# Patient Record
Sex: Female | Born: 1965 | Race: White | Hispanic: No | Smoking: Current every day smoker
Health system: Southern US, Community
[De-identification: ages and names within clinical notes are randomized; demographics above are authoritative.]

## PROBLEM LIST (undated history)

## (undated) DIAGNOSIS — F1121 Opioid dependence, in remission: Secondary | ICD-10-CM

## (undated) DIAGNOSIS — G8929 Other chronic pain: Secondary | ICD-10-CM

## (undated) DIAGNOSIS — E119 Type 2 diabetes mellitus without complications: Secondary | ICD-10-CM

## (undated) DIAGNOSIS — E669 Obesity, unspecified: Secondary | ICD-10-CM

## (undated) HISTORY — PX: KNEE ARTHROSCOPY: SUR90

---

## 2004-12-07 ENCOUNTER — Emergency Department (HOSPITAL_COMMUNITY): Admission: EM | Admit: 2004-12-07 | Discharge: 2004-12-07 | Payer: Self-pay | Admitting: Emergency Medicine

## 2005-01-06 ENCOUNTER — Emergency Department (HOSPITAL_COMMUNITY): Admission: EM | Admit: 2005-01-06 | Discharge: 2005-01-06 | Payer: Self-pay | Admitting: Emergency Medicine

## 2005-05-29 ENCOUNTER — Emergency Department (HOSPITAL_COMMUNITY): Admission: EM | Admit: 2005-05-29 | Discharge: 2005-05-29 | Payer: Self-pay | Admitting: Emergency Medicine

## 2010-10-17 ENCOUNTER — Emergency Department (HOSPITAL_COMMUNITY)
Admission: EM | Admit: 2010-10-17 | Discharge: 2010-10-17 | Payer: Self-pay | Source: Home / Self Care | Admitting: Emergency Medicine

## 2010-12-12 ENCOUNTER — Emergency Department (HOSPITAL_COMMUNITY)
Admission: EM | Admit: 2010-12-12 | Discharge: 2010-12-12 | Disposition: A | Discharge status: Home / Self Care | Payer: Self-pay | Attending: Emergency Medicine | Admitting: Emergency Medicine

## 2010-12-12 DIAGNOSIS — R51 Headache: Secondary | ICD-10-CM | POA: Insufficient documentation

## 2011-10-25 DIAGNOSIS — F1121 Opioid dependence, in remission: Secondary | ICD-10-CM

## 2011-10-25 HISTORY — DX: Opioid dependence, in remission: F11.21

## 2013-10-24 DIAGNOSIS — E669 Obesity, unspecified: Secondary | ICD-10-CM

## 2013-10-24 HISTORY — DX: Obesity, unspecified: E66.9

## 2014-04-22 ENCOUNTER — Encounter (HOSPITAL_COMMUNITY): Payer: Self-pay | Admitting: Emergency Medicine

## 2014-04-22 ENCOUNTER — Emergency Department (HOSPITAL_COMMUNITY): Payer: Self-pay

## 2014-04-22 ENCOUNTER — Inpatient Hospital Stay (HOSPITAL_COMMUNITY)
Admission: EM | Admit: 2014-04-22 | Discharge: 2014-04-27 | DRG: 440 | Disposition: A | Discharge status: Home / Self Care | Payer: Self-pay | Attending: Internal Medicine | Admitting: Internal Medicine

## 2014-04-22 DIAGNOSIS — G894 Chronic pain syndrome: Secondary | ICD-10-CM | POA: Diagnosis present

## 2014-04-22 DIAGNOSIS — K859 Acute pancreatitis without necrosis or infection, unspecified: Principal | ICD-10-CM | POA: Diagnosis present

## 2014-04-22 DIAGNOSIS — D72829 Elevated white blood cell count, unspecified: Secondary | ICD-10-CM | POA: Diagnosis present

## 2014-04-22 DIAGNOSIS — E782 Mixed hyperlipidemia: Secondary | ICD-10-CM | POA: Diagnosis present

## 2014-04-22 DIAGNOSIS — R079 Chest pain, unspecified: Secondary | ICD-10-CM | POA: Diagnosis present

## 2014-04-22 DIAGNOSIS — K838 Other specified diseases of biliary tract: Secondary | ICD-10-CM | POA: Diagnosis present

## 2014-04-22 DIAGNOSIS — E669 Obesity, unspecified: Secondary | ICD-10-CM | POA: Diagnosis present

## 2014-04-22 DIAGNOSIS — R933 Abnormal findings on diagnostic imaging of other parts of digestive tract: Secondary | ICD-10-CM

## 2014-04-22 DIAGNOSIS — E8881 Metabolic syndrome: Secondary | ICD-10-CM | POA: Diagnosis present

## 2014-04-22 DIAGNOSIS — I10 Essential (primary) hypertension: Secondary | ICD-10-CM | POA: Diagnosis present

## 2014-04-22 DIAGNOSIS — Z6836 Body mass index (BMI) 36.0-36.9, adult: Secondary | ICD-10-CM

## 2014-04-22 DIAGNOSIS — K760 Fatty (change of) liver, not elsewhere classified: Secondary | ICD-10-CM | POA: Diagnosis present

## 2014-04-22 DIAGNOSIS — R932 Abnormal findings on diagnostic imaging of liver and biliary tract: Secondary | ICD-10-CM

## 2014-04-22 DIAGNOSIS — K7689 Other specified diseases of liver: Secondary | ICD-10-CM | POA: Diagnosis present

## 2014-04-22 DIAGNOSIS — E785 Hyperlipidemia, unspecified: Secondary | ICD-10-CM | POA: Diagnosis present

## 2014-04-22 DIAGNOSIS — E119 Type 2 diabetes mellitus without complications: Secondary | ICD-10-CM | POA: Diagnosis present

## 2014-04-22 DIAGNOSIS — F1110 Opioid abuse, uncomplicated: Secondary | ICD-10-CM | POA: Diagnosis present

## 2014-04-22 DIAGNOSIS — F17200 Nicotine dependence, unspecified, uncomplicated: Secondary | ICD-10-CM | POA: Diagnosis present

## 2014-04-22 HISTORY — DX: Type 2 diabetes mellitus without complications: E11.9

## 2014-04-22 HISTORY — DX: Other chronic pain: G89.29

## 2014-04-22 HISTORY — DX: Obesity, unspecified: E66.9

## 2014-04-22 HISTORY — DX: Opioid dependence, in remission: F11.21

## 2014-04-22 LAB — CBC
HEMATOCRIT: 42.9 % (ref 36.0–46.0)
HEMOGLOBIN: 14.1 g/dL (ref 12.0–15.0)
MCH: 31.8 pg (ref 26.0–34.0)
MCHC: 32.9 g/dL (ref 30.0–36.0)
MCV: 96.8 fL (ref 78.0–100.0)
Platelets: 395 10*3/uL (ref 150–400)
RBC: 4.43 MIL/uL (ref 3.87–5.11)
RDW: 14.9 % (ref 11.5–15.5)
WBC: 13.8 10*3/uL — AB (ref 4.0–10.5)

## 2014-04-22 LAB — BASIC METABOLIC PANEL
BUN: 14 mg/dL (ref 6–23)
CHLORIDE: 98 meq/L (ref 96–112)
CO2: 22 meq/L (ref 19–32)
Calcium: 9.9 mg/dL (ref 8.4–10.5)
Creatinine, Ser: 0.89 mg/dL (ref 0.50–1.10)
GFR calc Af Amer: 86 mL/min — ABNORMAL LOW (ref >90)
GFR calc non Af Amer: 74 mL/min — ABNORMAL LOW (ref >90)
Glucose, Bld: 139 mg/dL — ABNORMAL HIGH (ref 70–99)
Potassium: 4.2 mEq/L (ref 3.7–5.3)
Sodium: 140 mEq/L (ref 137–147)

## 2014-04-22 LAB — LIPID PANEL
CHOL/HDL RATIO: 11.5 ratio
Cholesterol: 287 mg/dL — ABNORMAL HIGH (ref 0–200)
HDL: 25 mg/dL — ABNORMAL LOW (ref >39)
LDL Cholesterol: UNDETERMINED mg/dL (ref 0–99)
Triglycerides: 432 mg/dL — ABNORMAL HIGH (ref <150)
VLDL: UNDETERMINED mg/dL (ref 0–40)

## 2014-04-22 LAB — LIPASE, BLOOD: Lipase: 283 U/L — ABNORMAL HIGH (ref 11–59)

## 2014-04-22 LAB — TROPONIN I: Troponin I: <0.3 ng/mL (ref <0.30)

## 2014-04-22 MED ORDER — MORPHINE SULFATE 4 MG/ML IJ SOLN
4.0000 mg | INTRAMUSCULAR | Status: DC | PRN
Start: 2014-04-22 — End: 2014-04-23
  Filled 2014-04-22: qty 1

## 2014-04-22 MED ORDER — ONDANSETRON HCL 4 MG/2ML IJ SOLN
4.0000 mg | Freq: Once | INTRAMUSCULAR | Status: AC
  Administered 2014-04-22: 4 mg via INTRAVENOUS
  Filled 2014-04-22: qty 2

## 2014-04-22 MED ORDER — SUCRALFATE 1 G PO TABS
1.0000 g | ORAL_TABLET | Freq: Once | ORAL | Status: AC
  Administered 2014-04-22: 1 g via ORAL
  Filled 2014-04-22: qty 1

## 2014-04-22 MED ORDER — ONDANSETRON HCL 4 MG/2ML IJ SOLN
4.0000 mg | Freq: Once | INTRAMUSCULAR | Status: AC
Start: 2014-04-22 — End: 2014-04-22
  Administered 2014-04-22: 4 mg via INTRAVENOUS
  Filled 2014-04-22: qty 2

## 2014-04-22 MED ORDER — MORPHINE SULFATE 4 MG/ML IJ SOLN
4.0000 mg | INTRAMUSCULAR | Status: AC | PRN
  Administered 2014-04-22 – 2014-04-23 (×3): 4 mg via INTRAVENOUS
  Filled 2014-04-22 (×2): qty 1

## 2014-04-22 MED ORDER — FAMOTIDINE IN NACL 20-0.9 MG/50ML-% IV SOLN
20.0000 mg | Freq: Once | INTRAVENOUS | Status: AC
  Administered 2014-04-22: 20 mg via INTRAVENOUS
  Filled 2014-04-22: qty 50

## 2014-04-22 MED ORDER — GI COCKTAIL ~~LOC~~
30.0000 mL | Freq: Once | ORAL | Status: AC
  Administered 2014-04-22: 30 mL via ORAL
  Filled 2014-04-22: qty 30

## 2014-04-22 NOTE — ED Provider Notes (Signed)
CSN: [id number]     Arrival date & time 04/22/14  2002 History   First MD Initiated Contact with Patient 04/22/14 2014     Chief Complaint  Patient presents with  . Chest Pain      HPI  Patient presents with epigastric pain and chest pain and vomiting today. She waking this morning she didn't fill onset of some vomiting. She laid down.  She waking this afternoon at 4:00 and had pain in her epigastrium and chest and has initial episode of vomiting. Pain in her chest abdomen persisted, so she presents here. History of mild chronic back pain. History of narcotic dependence. She is currently followed at a methadone clinic. Is 160 mg of by mouth methadone daily.  Denies alcohol use. Is a smoker. Does not use any other medications.  No thiazides.  No history of hypercholesterolemia. No personal or family history of pancreatitis.  Past Medical History  Diagnosis Date  . Substance abuse    History reviewed. No pertinent past surgical history. History reviewed. No pertinent family history. History  Substance Use Topics  . Smoking status: Current Every Day Smoker  . Smokeless tobacco: Not on file  . Alcohol Use: No   OB History   Grav Para Term Preterm Abortions TAB SAB Ect Mult Living                 Review of Systems  Constitutional: Negative for fever, chills, diaphoresis, appetite change and fatigue.  HENT: Negative for mouth sores, sore throat and trouble swallowing.   Eyes: Negative for visual disturbance.  Respiratory: Negative for cough, chest tightness, shortness of breath and wheezing.   Cardiovascular: Positive for chest pain.  Gastrointestinal: Positive for nausea, vomiting and abdominal pain. Negative for diarrhea and abdominal distention.  Endocrine: Negative for polydipsia, polyphagia and polyuria.  Genitourinary: Negative for dysuria, frequency and hematuria.  Musculoskeletal: Negative for gait problem.  Skin: Negative for color change, pallor and rash.  Neurological:  Negative for dizziness, syncope, light-headedness and headaches.  Hematological: Does not bruise/bleed easily.  Psychiatric/Behavioral: Negative for behavioral problems and confusion.      Allergies  Review of patient's allergies indicates no known allergies.  Home Medications   Prior to Admission medications   Medication Sig Start Date End Date Taking? Authorizing Provider  diphenhydramine-acetaminophen (TYLENOL PM) 25-500 MG TABS Take 1 tablet by mouth daily as needed (for pain/sleep).   Yes Historical Provider, MD  METHADONE HCL PO Take by mouth. Patient says she takes "160" from the methadone clinic, but was unable to answer any further questions or name the clinic.   Yes Historical Provider, MD   BP 140/59  Pulse 78  Temp(Src) 98 F (36.7 C) (Oral)  Resp 14  Ht 5\' 7"  (1.702 m)  Wt 212 lb (96.163 kg)  BMI 33.20 kg/m2  SpO2 96% Physical Exam  Constitutional: She is oriented to person, place, and time. She appears well-developed and well-nourished. No distress.  HENT:  Head: Normocephalic.  Eyes: Conjunctivae are normal. Pupils are equal, round, and reactive to light. No scleral icterus.  Neck: Normal range of motion. Neck supple. No thyromegaly present.  Cardiovascular: Normal rate and regular rhythm.  Exam reveals no gallop and no friction rub.   No murmur heard. Pulmonary/Chest: Effort normal and breath sounds normal. No respiratory distress. She has no wheezes. She has no rales.  Abdominal: Soft. Bowel sounds are normal. She exhibits no distension. There is tenderness. There is no rebound.  Musculoskeletal: Normal range  of motion.  Neurological: She is alert and oriented to person, place, and time.  Skin: Skin is warm and dry. No rash noted.  Psychiatric: She has a normal mood and affect. Her behavior is normal.    ED Course  Procedures (including critical care time) Labs Review Labs Reviewed  CBC - Abnormal; Notable for the following:    WBC 13.8 (*)    All  other components within normal limits  BASIC METABOLIC PANEL - Abnormal; Notable for the following:    Glucose, Bld 139 (*)    GFR calc non Af Amer 74 (*)    GFR calc Af Amer 86 (*)    All other components within normal limits  LIPASE, BLOOD - Abnormal; Notable for the following:    Lipase 283 (*)    All other components within normal limits  LIPID PANEL - Abnormal; Notable for the following:    Cholesterol 287 (*)    Triglycerides 432 (*)    HDL 25 (*)    All other components within normal limits  TROPONIN I  HEPATIC FUNCTION PANEL    Imaging Review Dg Chest 2 View  04/22/2014   CLINICAL DATA:  Chest pain  EXAM: CHEST  2 VIEW  COMPARISON:  None.  FINDINGS: The heart size and mediastinal contours are within normal limits. Both lungs are clear. The visualized skeletal structures are unremarkable.  IMPRESSION: No active cardiopulmonary disease.   Electronically Signed   By: Signa Kellaylor  Stroud M.D.   On: 04/22/2014 21:14     EKG Interpretation None      MDM   Final diagnoses:  Acute pancreatitis, unspecified pancreatitis type   EKG shows no acute abnormalities. Troponin normal. Lipase elevated at 287. Chest x-ray shows no acute abnormalities. Ultrasound of the abdomen, and hepatic function panel requested and pending. Triglyceride level 432, elevated, but not to level expected for pancreatitis.  Hepatic enzymes and ultrasound pending.  01:00:  Ultrasound with edematous pancreas. No biliary dilatation or abnormalities noted. Minimal elevation of transaminases. Alkaline phosphatase 167. Fatty liver noted. Patient still complaining of discomfort and nausea. Still tender in the epigastrium. Plan will be admission.    Rolland PorterMark Sanika Brosious, MD 04/23/14 [phone]

## 2014-04-22 NOTE — ED Notes (Signed)
Patient transported to X-ray 

## 2014-04-22 NOTE — ED Notes (Signed)
Patient transported to Ultrasound 

## 2014-04-22 NOTE — ED Notes (Addendum)
Pt. arrived with EMS from home reports substernal chest pain with nausea and vomitting onset today , no SOB or diaphoresis , pt. stated she is in a lot of "stress these past several days / Methadone 12 hours ago " . Pt. received 4 baby ASA and 1 NTG sl prior to arrival.

## 2014-04-23 ENCOUNTER — Encounter (HOSPITAL_COMMUNITY): Payer: Self-pay | Admitting: Radiology

## 2014-04-23 ENCOUNTER — Inpatient Hospital Stay (HOSPITAL_COMMUNITY): Payer: Self-pay

## 2014-04-23 DIAGNOSIS — K859 Acute pancreatitis without necrosis or infection, unspecified: Principal | ICD-10-CM | POA: Diagnosis present

## 2014-04-23 DIAGNOSIS — G894 Chronic pain syndrome: Secondary | ICD-10-CM | POA: Diagnosis present

## 2014-04-23 DIAGNOSIS — K760 Fatty (change of) liver, not elsewhere classified: Secondary | ICD-10-CM | POA: Diagnosis present

## 2014-04-23 DIAGNOSIS — R079 Chest pain, unspecified: Secondary | ICD-10-CM | POA: Diagnosis present

## 2014-04-23 LAB — HEPATIC FUNCTION PANEL
ALBUMIN: 4.6 g/dL (ref 3.5–5.2)
ALK PHOS: 167 U/L — AB (ref 39–117)
ALT: 77 U/L — ABNORMAL HIGH (ref 0–35)
AST: 63 U/L — AB (ref 0–37)
Bilirubin, Direct: <0.2 mg/dL (ref 0.0–0.3)
Total Bilirubin: 0.4 mg/dL (ref 0.3–1.2)
Total Protein: 8.2 g/dL (ref 6.0–8.3)

## 2014-04-23 LAB — COMPREHENSIVE METABOLIC PANEL
ALK PHOS: 164 U/L — AB (ref 39–117)
ALT: 73 U/L — ABNORMAL HIGH (ref 0–35)
AST: 47 U/L — AB (ref 0–37)
Albumin: 4.6 g/dL (ref 3.5–5.2)
BUN: 14 mg/dL (ref 6–23)
CALCIUM: 9.5 mg/dL (ref 8.4–10.5)
CO2: 22 meq/L (ref 19–32)
Chloride: 95 mEq/L — ABNORMAL LOW (ref 96–112)
Creatinine, Ser: 0.82 mg/dL (ref 0.50–1.10)
GFR calc Af Amer: >90 mL/min (ref >90)
GFR calc non Af Amer: 82 mL/min — ABNORMAL LOW (ref >90)
Glucose, Bld: 162 mg/dL — ABNORMAL HIGH (ref 70–99)
POTASSIUM: 4.2 meq/L (ref 3.7–5.3)
Sodium: 136 mEq/L — ABNORMAL LOW (ref 137–147)
Total Bilirubin: 0.4 mg/dL (ref 0.3–1.2)
Total Protein: 8.2 g/dL (ref 6.0–8.3)

## 2014-04-23 LAB — TROPONIN I: Troponin I: <0.3 ng/mL (ref <0.30)

## 2014-04-23 LAB — LIPASE, BLOOD: Lipase: 631 U/L — ABNORMAL HIGH (ref 11–59)

## 2014-04-23 LAB — CBC
HEMATOCRIT: 40.4 % (ref 36.0–46.0)
HEMOGLOBIN: 13.5 g/dL (ref 12.0–15.0)
MCH: 31.8 pg (ref 26.0–34.0)
MCHC: 33.4 g/dL (ref 30.0–36.0)
MCV: 95.3 fL (ref 78.0–100.0)
Platelets: 415 10*3/uL — ABNORMAL HIGH (ref 150–400)
RBC: 4.24 MIL/uL (ref 3.87–5.11)
RDW: 15 % (ref 11.5–15.5)
WBC: 16.7 10*3/uL — ABNORMAL HIGH (ref 4.0–10.5)

## 2014-04-23 LAB — RAPID URINE DRUG SCREEN, HOSP PERFORMED
AMPHETAMINES: NOT DETECTED
Barbiturates: POSITIVE — AB
Benzodiazepines: NOT DETECTED
COCAINE: NOT DETECTED
Opiates: POSITIVE — AB
TETRAHYDROCANNABINOL: NOT DETECTED

## 2014-04-23 LAB — PROTIME-INR
INR: 0.99 (ref 0.00–1.49)
Prothrombin Time: 13.1 seconds (ref 11.6–15.2)

## 2014-04-23 LAB — ETHANOL: Alcohol, Ethyl (B): <11 mg/dL (ref 0–11)

## 2014-04-23 MED ORDER — METHADONE HCL 5 MG PO TABS
160.0000 mg | ORAL_TABLET | Freq: Every day | ORAL | Status: DC
  Filled 2014-04-23: qty 32

## 2014-04-23 MED ORDER — IOHEXOL 300 MG/ML  SOLN
25.0000 mL | INTRAMUSCULAR | Status: AC
  Administered 2014-04-23 (×2): 25 mL via ORAL

## 2014-04-23 MED ORDER — ONDANSETRON HCL 4 MG/2ML IJ SOLN
4.0000 mg | Freq: Once | INTRAMUSCULAR | Status: AC
  Administered 2014-04-23: 4 mg via INTRAVENOUS
  Filled 2014-04-23: qty 2

## 2014-04-23 MED ORDER — ONDANSETRON HCL 4 MG PO TABS
4.0000 mg | ORAL_TABLET | Freq: Four times a day (QID) | ORAL | Status: DC | PRN
  Administered 2014-04-23: 4 mg via ORAL
  Filled 2014-04-23: qty 1

## 2014-04-23 MED ORDER — SODIUM CHLORIDE 0.9 % IV SOLN
INTRAVENOUS | Status: DC
  Administered 2014-04-23 – 2014-04-24 (×4): via INTRAVENOUS

## 2014-04-23 MED ORDER — ONDANSETRON 8 MG/NS 50 ML IVPB
8.0000 mg | Freq: Four times a day (QID) | INTRAVENOUS | Status: DC | PRN
  Filled 2014-04-23 (×2): qty 8

## 2014-04-23 MED ORDER — ONDANSETRON HCL 4 MG PO TABS: 4.0000 mg | ORAL_TABLET | Freq: Four times a day (QID) | ORAL | Status: DC | PRN

## 2014-04-23 MED ORDER — SODIUM CHLORIDE 0.9 % IJ SOLN
3.0000 mL | Freq: Two times a day (BID) | INTRAMUSCULAR | Status: DC
  Administered 2014-04-23 – 2014-04-27 (×4): 3 mL via INTRAVENOUS

## 2014-04-23 MED ORDER — METHADONE HCL 40 MG PO TBSO: 160.0000 mg | ORAL_TABLET | Freq: Every day | ORAL | Status: DC

## 2014-04-23 MED ORDER — METHADONE HCL 10 MG/ML PO CONC
160.0000 mg | Freq: Every day | ORAL | Status: DC
  Administered 2014-04-23: 160 mg via ORAL
  Filled 2014-04-23 (×2): qty 16

## 2014-04-23 MED ORDER — ONDANSETRON HCL 4 MG/2ML IJ SOLN: 4.0000 mg | Freq: Four times a day (QID) | INTRAMUSCULAR | Status: DC | PRN

## 2014-04-23 MED ORDER — HYDROMORPHONE HCL PF 1 MG/ML IJ SOLN
1.0000 mg | INTRAMUSCULAR | Status: DC
  Administered 2014-04-23 – 2014-04-25 (×13): 1 mg via INTRAVENOUS
  Filled 2014-04-23 (×13): qty 1

## 2014-04-23 MED ORDER — PANTOPRAZOLE SODIUM 40 MG IV SOLR
40.0000 mg | INTRAVENOUS | Status: DC
  Administered 2014-04-24 – 2014-04-27 (×4): 40 mg via INTRAVENOUS
  Filled 2014-04-23 (×6): qty 40

## 2014-04-23 MED ORDER — BIOTENE DRY MOUTH MT LIQD
15.0000 mL | Freq: Two times a day (BID) | OROMUCOSAL | Status: DC
  Administered 2014-04-23 – 2014-04-27 (×5): 15 mL via OROMUCOSAL

## 2014-04-23 MED ORDER — HYDROMORPHONE HCL PF 1 MG/ML IJ SOLN
0.5000 mg | INTRAMUSCULAR | Status: DC | PRN
  Administered 2014-04-23 (×2): 1 mg via INTRAVENOUS
  Filled 2014-04-23 (×2): qty 1

## 2014-04-23 MED ORDER — ONDANSETRON HCL 4 MG/2ML IJ SOLN
4.0000 mg | Freq: Four times a day (QID) | INTRAMUSCULAR | Status: DC
  Administered 2014-04-23 – 2014-04-25 (×9): 4 mg via INTRAVENOUS
  Filled 2014-04-23 (×9): qty 2

## 2014-04-23 MED ORDER — IOHEXOL 300 MG/ML  SOLN
100.0000 mL | Freq: Once | INTRAMUSCULAR | Status: AC | PRN
  Administered 2014-04-23: 100 mL via INTRAVENOUS

## 2014-04-23 MED ORDER — PANTOPRAZOLE SODIUM 40 MG IV SOLR
40.0000 mg | Freq: Two times a day (BID) | INTRAVENOUS | Status: DC
  Administered 2014-04-23: 40 mg via INTRAVENOUS
  Filled 2014-04-23 (×2): qty 40

## 2014-04-23 MED ORDER — HYDROMORPHONE HCL PF 1 MG/ML IJ SOLN
1.0000 mg | Freq: Once | INTRAMUSCULAR | Status: AC
  Administered 2014-04-23: 1 mg via INTRAVENOUS
  Filled 2014-04-23: qty 1

## 2014-04-23 MED ORDER — METHADONE HCL 10 MG PO TABS
160.0000 mg | ORAL_TABLET | Freq: Every day | ORAL | Status: DC
  Administered 2014-04-24: 160 mg via ORAL
  Filled 2014-04-23 (×2): qty 16

## 2014-04-23 MED ORDER — ENOXAPARIN SODIUM 40 MG/0.4ML ~~LOC~~ SOLN
40.0000 mg | SUBCUTANEOUS | Status: DC
  Administered 2014-04-23 – 2014-04-27 (×5): 40 mg via SUBCUTANEOUS
  Filled 2014-04-23 (×5): qty 0.4

## 2014-04-23 MED ORDER — PROMETHAZINE HCL 25 MG/ML IJ SOLN
25.0000 mg | Freq: Four times a day (QID) | INTRAMUSCULAR | Status: DC | PRN
  Administered 2014-04-23 – 2014-04-24 (×4): 25 mg via INTRAVENOUS
  Filled 2014-04-23 (×4): qty 1

## 2014-04-23 NOTE — Progress Notes (Signed)
Pt was administered 160 mg po liquid methadone from methadone clinic, Dr. Butler Denmarkizwan ordered ok to take med from methadone clinic today.  Pharmacy verified methadone, nurse to administer now and patient to store bottle at bedside to take back to methadone clinic. Pt has possession of methadone bottle for clinic.

## 2014-04-23 NOTE — Progress Notes (Addendum)
New Admission Note:   Arrival: via stretcher with ED tech Mental Orientation: A&Ox4 Telemetry: placed on box 6E10, NSR at 94 Assessment:  See doc flowsheet Skin: intact, scratch marks on back, old right knee incision from "knee surgery" per pt, bruises on arms bilaterally IV: left hand IV, saline locked  Pain: states 10/10 chest and epigastric pain, MD already aware Safety Measures:  Call bell placed within reach; patient instructed on use of call bell and verbalized understanding. Bed in lowest position.  Non-skid socks on. [address] Orientation: Patient oriented to staff, room, and unit. Family: none at bedside  Admission questions deferred to AM per pt request.  Orders have been reviewed and implemented. Will continue to monitor.  Rozann Leschesebecca Neilan Rizzo, RN, BSN

## 2014-04-23 NOTE — Progress Notes (Signed)
Dr. Butler Denmarkizwan [facility] and be [facility] and then to dispence ok to take home medication this dose.

## 2014-04-23 NOTE — H&P (Signed)
[facility] History and Physical  Patient: Valerie Woodard  ZOX:[id number]  DOB: [birth year]  DOS: the patient was seen and examined on 04/23/2014 PCP: PROVIDER NOT IN SYSTEM  Chief Complaint: Chest pain  HPI: Valerie Woodard is a 48 y.o. female with Past medical history of substance abuse and chronic pain. Patient presented with complaints of chest pain. She mentions that she has on and off chest pain since last 2 years occurring every couple of month. Last episode was 2 months ago. She does not have any shortness of breath at her baseline. Since last 2 days she has been having chest pain which is more located in her epigastric region. The pain is burning and sharp and radiating to her back. She denies any fever or chills, shortness of breath, cough, diarrhea, leg swelling, burning urination, orthopnea, PND. She complains of nausea and vomiting with 6-7 episodes of vomiting and then multiple episodes of dry heaving. She mentions she also stool chart straight pain management center and gets methadone 160 mg daily since last 2 years. She denies any alcohol abuse she denies any prior abdominal pain with fatty meal, she denies any smoking history she denies any drug abuse.  The patient is coming from home. And at her baseline independent for most of her ADL.  Review of Systems: as mentioned in the history of present illness.  A Comprehensive review of the other systems is negative.  Past Medical History  Diagnosis Date  . Substance abuse    History reviewed. No pertinent past surgical history. Social History:  reports that she has been smoking.  She does not have any smokeless tobacco history on file. She reports that she does not drink alcohol or use illicit drugs.  No Known Allergies  History reviewed. No pertinent family history.  Prior to Admission medications   Medication Sig Start Date End Date Taking? Authorizing Provider  diphenhydramine-acetaminophen (TYLENOL PM) 25-500 MG TABS  Take 1 tablet by mouth daily as needed (for pain/sleep).   Yes Historical Provider, MD  METHADONE HCL PO Take 160 mg by mouth daily. Patient says she takes "160" from the methadone clinic, but was unable to answer any further questions or name the clinic.   Yes Historical Provider, MD    Physical Exam: Filed Vitals:   04/23/14 0148 04/23/14 0212 04/23/14 0450 04/23/14 0536  BP: 145/56 149/88  142/92  Pulse: 95 85  95  Temp:  98.3 F (36.8 C)  98.6 F (37 C)  TempSrc:  Oral  Oral  Resp: 14 16  19   Height:      Weight:   104.6 kg (230 lb 9.6 oz)   SpO2: 100% 98%  95%    General: Alert, Awake and Oriented to Time, Place and Person. Appear in marked distress Eyes: PERRL ENT: Oral Mucosa clear moist. Neck: No JVD Cardiovascular: S1 and S2 Present, no Murmur, Peripheral Pulses Present Respiratory: Bilateral Air entry equal and Decreased, Clear to Auscultation,  No Crackles, no wheezes Abdomen: Bowel Sound Present, Soft and significantly diffusely tender Skin: No Rash Extremities: No Pedal edema, no calf tenderness Neurologic: Grossly no focal neuro deficit.  Labs on Admission:  CBC:  Recent Labs Lab 04/22/14 2028 04/23/14 0243  WBC 13.8* 16.7*  HGB 14.1 13.5  HCT 42.9 40.4  MCV 96.8 95.3  PLT 395 415*    CMP     Component Value Date/Time   NA 136* 04/23/2014 0243   K 4.2 04/23/2014 0243   CL 95* 04/23/2014 0243  CO2 22 04/23/2014 0243   GLUCOSE 162* 04/23/2014 0243   BUN 14 04/23/2014 0243   CREATININE 0.82 04/23/2014 0243   CALCIUM 9.5 04/23/2014 0243   PROT 8.2 04/23/2014 0243   ALBUMIN 4.6 04/23/2014 0243   AST 47* 04/23/2014 0243   ALT 73* 04/23/2014 0243   ALKPHOS 164* 04/23/2014 0243   BILITOT 0.4 04/23/2014 0243   GFRNONAA 82* 04/23/2014 0243   GFRAA >90 04/23/2014 0243     Recent Labs Lab 04/22/14 2028 04/23/14 0243  LIPASE 283* 631*   No results found for this basename: AMMONIA,  in the last 168 hours   Recent Labs Lab 04/22/14 2028 04/23/14 0243  TROPONINI <0.30  <0.30   BNP (last 3 results) No results found for this basename: PROBNP,  in the last 8760 hours  Radiological Exams on Admission: Dg Chest 2 View  04/22/2014   CLINICAL DATA:  Chest pain  EXAM: CHEST  2 VIEW  COMPARISON:  None.  FINDINGS: The heart size and mediastinal contours are within normal limits. Both lungs are clear. The visualized skeletal structures are unremarkable.  IMPRESSION: No active cardiopulmonary disease.   Electronically Signed   By: Signa Kellaylor  Stroud M.D.   On: 04/22/2014 21:14   [place] Abdomen Complete  04/23/2014   CLINICAL DATA:  Abdominal pain and emesis.  Pancreatitis.  EXAM: ULTRASOUND ABDOMEN COMPLETE  COMPARISON:  None.  FINDINGS: Gallbladder:  No gallstones or wall thickening visualized. No sonographic Murphy sign noted.  Common bile duct:  Diameter: 5.6 mm.  Liver:  No focal lesion identified.  Increased parenchymal echogenicity.  IVC:  No abnormality visualized.  Pancreas:  The pancreas appears inhomogeneous.  Spleen:  The spleen measures 13.5 cm in length.  Right Kidney:  Length: 13.4 cm. Echogenicity within normal limits. No mass or hydronephrosis visualized.  Left Kidney:  Length: 13.5 cm. Echogenicity within normal limits. No mass or hydronephrosis visualized.  Abdominal aorta:  No aneurysm visualized.  Other findings:  None.  IMPRESSION: 1. Hepatic steatosis 2. Edema of the pancreas consistent with a history of pancreatitis. 3. No gallstones identified.  No biliary dilatation.   Electronically Signed   By: Signa Kellaylor  Stroud M.D.   On: 04/23/2014 00:32    EKG: Independently reviewed. normal sinus rhythm, T-wave inversion in 23 aVF.  Assessment/Plan Principal Problem:   Acute pancreatitis Active Problems:   Chest pain   Chronic pain syndrome   1. Acute pancreatitis Patient is also complains of abdominal pain, chest pain. On further workup she was found to have elevated lipase, mildly elevated triglyceride levels, swelling at pancreas on ultrasound abdomen. With this  she appears to have pancreatitis. She will be admitted to the hospital kept n.p.o. I will give her IV pain medications for pain as needed, Zofran as needed, Protonix twice a day. IV hydration.  2. Chest pain. Patient has T wave inversions in 23 aVF. We do not have any prior EKG to compare. Patient's initial troponin is negative. Patient appears to have pancreatitis rather than any cardiac chest pain. At present we will monitor her on telemetry and follow serial troponins.  3. Chronic pain syndrome Check urine toxicology screen and to continue with methadone to avoid withdrawal  DVT Prophylaxis: subcutaneous Heparin Nutrition: N.p.o.  Code Status: Full  Disposition: Admitted to inpatient in telemetry unit.  Author: Lynden OxfordPranav Theseus Birnie, MD [facility] Pager: [phone] 04/23/2014, 6:45 AM    If 7PM-7AM, please contact night-coverage [email or web address] Password [email or web address]  **Disclaimer: This note may have been dictated with  voice recognition software. Similar sounding words can inadvertently be transcribed and this note may contain transcription errors which may not have been corrected upon publication of note.**

## 2014-04-23 NOTE — Progress Notes (Signed)
Pt stated Doctor advised she could take methadone from home paged Dr. Butler Denmarkizwan for clarification. Advised pt medication would have to go to pharmacy and would be dispensed from pharmacy.

## 2014-04-23 NOTE — Progress Notes (Addendum)
[facility] Progress Note   Valerie LibraWendy Courington RUE:[id number] DOB: 02/13/1966 DOA: 04/22/2014 PCP: PROVIDER NOT IN SYSTEM  Brief narrative: Valerie Woodard is a 48 y.o. female  presents with epigastric pain and is found to have acute pancreatitis   Subjective: continues to have severe nausea and epigastric pain  Assessment/Plan: Principal Problem:   Acute pancreatitis - Ultrasound reveal pancreatiic inflammation- obtain CT to look at gallbladder - Triglycerides not elevated enough to cause this- no offending medications - cont NPO and IVF  Active Problems:    Chronic pain syndrome - goes to methadone clinic and is on high dose methadone- has been at current dose for 1 yr- cont Methadone- pt's family has brought in her home bott    Hepatic steatosis - advised slow weight loss  Mixed hyperlipidemia - dietician consult for low fat diet - will address medications once she is able to eat  Leukocytosis - likely stress demargination- no fevers follow    Code Status: Full code Family Communication: none Disposition Plan: home  Consultants: none  Procedures: none  Antibiotics: Anti-infectives   None       DVT prophylaxis: Lovenox  [facility]: [facility]   04/22/14 2020 04/23/14 0450  Weight: 96.163 kg (212 lb) 104.6 kg (230 lb 9.6 oz)    Vitals Filed Vitals:   04/23/14 0212 04/23/14 0450 04/23/14 0536 04/23/14 0959  BP: 149/88  142/92 157/80  Pulse: 85  95 96  Temp: 98.3 F (36.8 C)  98.6 F (37 C) 98.5 F (36.9 C)  TempSrc: Oral  Oral Oral  Resp: 16  19 22   Height:      Weight:  104.6 kg (230 lb 9.6 oz)    SpO2: 98%  95% 97%      Intake/Output Summary (Last 24 hours) at 04/23/14 1633 Last data filed at 04/23/14 0343  Gross per 24 hour  Intake      3 ml  Output      0 ml  Net      3 ml     Exam: General: No acute respiratory distress Lungs: Clear to auscultation bilaterally without wheezes or crackles Cardiovascular: Regular rate and  rhythm without murmur gallop or rub normal S1 and S2 Abdomen: tender in epigastrium, nondistended, soft, bowel sounds positive Extremities: No significant cyanosis, clubbing, or edema bilateral lower extremities  Data Reviewed: Basic Metabolic Panel:  Recent Labs Lab 04/22/14 2028 04/23/14 0243  NA 140 136*  K 4.2 4.2  CL 98 95*  CO2 22 22  GLUCOSE 139* 162*  BUN 14 14  CREATININE 0.89 0.82  CALCIUM 9.9 9.5   Liver Function Tests:  Recent Labs Lab 04/22/14 2244 04/23/14 0243  AST 63* 47*  ALT 77* 73*  ALKPHOS 167* 164*  BILITOT 0.4 0.4  PROT 8.2 8.2  ALBUMIN 4.6 4.6    Recent Labs Lab 04/22/14 2028 04/23/14 0243  LIPASE 283* 631*   No results found for this basename: AMMONIA,  in the last 168 hours CBC:  Recent Labs Lab 04/22/14 2028 04/23/14 0243  WBC 13.8* 16.7*  HGB 14.1 13.5  HCT 42.9 40.4  MCV 96.8 95.3  PLT 395 415*   Cardiac Enzymes:  Recent Labs Lab 04/22/14 2028 04/23/14 0243 04/23/14 0814  TROPONINI <0.30 <0.30 <0.30   BNP (last 3 results) No results found for this basename: PROBNP,  in the last 8760 hours CBG: No results found for this basename: GLUCAP,  in the last 168 hours  No results found for this  or any previous visit (from the past 240 hour(s)).   Studies:  Recent x-ray studies have been reviewed in detail by the Attending Physician  Scheduled Meds:  Scheduled Meds: . antiseptic oral rinse  15 mL Mouth Rinse BID  . enoxaparin (LOVENOX) injection  40 mg Subcutaneous Q24H  .  HYDROmorphone (DILAUDID) injection  1 mg Intravenous Q4H  . [START ON 04/24/2014] methadone  160 mg Oral Daily  . ondansetron (ZOFRAN) IV  4 mg Intravenous Q6H  . [START ON 04/24/2014] pantoprazole (PROTONIX) IV  40 mg Intravenous Q24H  . sodium chloride  3 mL Intravenous Q12H   Continuous Infusions: . sodium chloride 125 mL/hr at 04/23/14 1435    Time spent on care of this patient: >35 min   Moiz Ryant, MD 04/23/2014, 4:33 PM  LOS: 1 day    [facility] Office  [phone] Pager - Text Page per Loretha StaplerAmion   If 7PM-7AM, please contact night-coverage [email or web address]

## 2014-04-23 NOTE — Progress Notes (Deleted)
Pt was administered methadone 160 ml po pt to store bottle at bedside for return to [facility]..

## 2014-04-24 ENCOUNTER — Encounter (HOSPITAL_COMMUNITY): Payer: Self-pay | Admitting: Physician Assistant

## 2014-04-24 DIAGNOSIS — R932 Abnormal findings on diagnostic imaging of liver and biliary tract: Secondary | ICD-10-CM

## 2014-04-24 DIAGNOSIS — K7689 Other specified diseases of liver: Secondary | ICD-10-CM

## 2014-04-24 DIAGNOSIS — R933 Abnormal findings on diagnostic imaging of other parts of digestive tract: Secondary | ICD-10-CM

## 2014-04-24 LAB — CBC
HCT: 39.7 % (ref 36.0–46.0)
HEMOGLOBIN: 12.7 g/dL (ref 12.0–15.0)
MCH: 31.4 pg (ref 26.0–34.0)
MCHC: 32 g/dL (ref 30.0–36.0)
MCV: 98.3 fL (ref 78.0–100.0)
Platelets: 300 10*3/uL (ref 150–400)
RBC: 4.04 MIL/uL (ref 3.87–5.11)
RDW: 15.3 % (ref 11.5–15.5)
WBC: 12.1 10*3/uL — AB (ref 4.0–10.5)

## 2014-04-24 LAB — BASIC METABOLIC PANEL
ANION GAP: 17 — AB (ref 5–15)
BUN: 8 mg/dL (ref 6–23)
CHLORIDE: 95 meq/L — AB (ref 96–112)
CO2: 25 meq/L (ref 19–32)
CREATININE: 0.77 mg/dL (ref 0.50–1.10)
Calcium: 9 mg/dL (ref 8.4–10.5)
GFR calc non Af Amer: >90 mL/min (ref >90)
Glucose, Bld: 111 mg/dL — ABNORMAL HIGH (ref 70–99)
Potassium: 3.8 mEq/L (ref 3.7–5.3)
Sodium: 137 mEq/L (ref 137–147)

## 2014-04-24 LAB — LIPASE, BLOOD: Lipase: 461 U/L — ABNORMAL HIGH (ref 11–59)

## 2014-04-24 MED ORDER — DEXTROSE-NACL 5-0.45 % IV SOLN
INTRAVENOUS | Status: DC
  Administered 2014-04-24 – 2014-04-25 (×5): via INTRAVENOUS
  Administered 2014-04-26: 125 mL/h via INTRAVENOUS
  Administered 2014-04-26 – 2014-04-27 (×2): via INTRAVENOUS

## 2014-04-24 MED ORDER — METHADONE HCL 10 MG/ML PO CONC
160.0000 mg | Freq: Every day | ORAL | Status: DC
  Administered 2014-04-24: 160 mg via ORAL
  Filled 2014-04-24: qty 16

## 2014-04-24 NOTE — Progress Notes (Addendum)
[facility] Progress Note   Valerie LibraWendy Maggard [id number] DOB: 08/25/1966 DOA: 04/22/2014 PCP: PROVIDER NOT IN SYSTEM  Brief narrative: Valerie Woodard is a 48 y.o. female  presents with epigastric pain and is found to have acute pancreatitis   Subjective: continues to have severe nausea and epigastric pain  Assessment/Plan: Principal Problem:   Acute pancreatitis - Ultrasound reveal pancreatiic inflammation-  CT reveals CBD of 9 mm- have asked for GI eval - Triglycerides not elevated enough to cause this- no offending medications - cont NPO and IVF- GI switching to D51/2NS at 200cc/hr  Active Problems:    Chronic opiod dependence - on extremely high dose Methadone for back pain- does not have PCP and only goes to the Methadone clinic - goes to methadone clinic and is on high dose methadone- has been at current dose for 1 yr- cont Methadone- pt's family has brought in her home bottle - DO NOT want to overmedicate with narcotics while in hospital - will ask case management to find her a PCP- she does need further w/u of her back issues as this amount of Methadone is not ideal     Hepatic steatosis - advised slow weight loss  Mixed hyperlipidemia - dietician consult for low fat diet - will address medications once she is able to eat  Leukocytosis - likely stress demargination- no fevers follow  Mild hyperglycemia - check A1c    Code Status: Full code Family Communication: none Disposition Plan: home  Consultants: none  Procedures: none  Antibiotics: Anti-infectives   None       DVT prophylaxis: Lovenox  [facility]   04/22/14 2020 04/23/14 0450  Weight: 96.163 kg (212 lb) 104.6 kg (230 lb 9.6 oz)    Vitals Filed Vitals:   04/23/14 1736 04/23/14 2030 04/24/14 0602 04/24/14 1004  BP: 156/74 150/78 134/78 143/83  Pulse: 96 88 93 93  Temp: 97.8 F (36.6 C) 98.4 F (36.9 C) 99.1 F (37.3 C) 99 F (37.2 C)  TempSrc: Oral Oral Oral Oral   Resp: 20 19 20 18   Height:      Weight:      SpO2: 95% 93% 91% 93%      Intake/Output Summary (Last 24 hours) at 04/24/14 1302 Last data filed at 04/24/14 1259  Gross per 24 hour  Intake      0 ml  Output      0 ml  Net      0 ml     Exam: General: No acute respiratory distress Lungs: Clear to auscultation bilaterally without wheezes or crackles Cardiovascular: Regular rate and rhythm without murmur gallop or rub normal S1 and S2 Abdomen: tender in epigastrium, nondistended, soft, bowel sounds positive Extremities: No significant cyanosis, clubbing, or edema bilateral lower extremities  Data Reviewed: Basic Metabolic Panel:  Recent Labs Lab 04/22/14 2028 04/23/14 0243 04/24/14 0454  NA 140 136* 137  K 4.2 4.2 3.8  CL 98 95* 95*  CO2 22 22 25   GLUCOSE 139* 162* 111*  BUN 14 14 8   CREATININE 0.89 0.82 0.77  CALCIUM 9.9 9.5 9.0   Liver Function Tests:  Recent Labs Lab 04/22/14 2244 04/23/14 0243  AST 63* 47*  ALT 77* 73*  ALKPHOS 167* 164*  BILITOT 0.4 0.4  PROT 8.2 8.2  ALBUMIN 4.6 4.6    Recent Labs Lab 04/22/14 2028 04/23/14 0243 04/24/14 0454  LIPASE 283* 631* 461*   No results found for this basename: AMMONIA,  in the last 168  hours CBC:  Recent Labs Lab 04/22/14 2028 04/23/14 0243 04/24/14 0454  WBC 13.8* 16.7* 12.1*  HGB 14.1 13.5 12.7  HCT 42.9 40.4 39.7  MCV 96.8 95.3 98.3  PLT 395 415* 300   Cardiac Enzymes:  Recent Labs Lab 04/22/14 2028 04/23/14 0243 04/23/14 0814  TROPONINI <0.30 <0.30 <0.30   BNP (last 3 results) No results found for this basename: PROBNP,  in the last 8760 hours CBG: No results found for this basename: GLUCAP,  in the last 168 hours  No results found for this or any previous visit (from the past 240 hour(s)).   Studies:  Recent x-ray studies have been reviewed in detail by the Attending Physician  Scheduled Meds:  Scheduled Meds: . antiseptic oral rinse  15 mL Mouth Rinse BID  .  enoxaparin (LOVENOX) injection  40 mg Subcutaneous Q24H  .  HYDROmorphone (DILAUDID) injection  1 mg Intravenous Q4H  . methadone  160 mg Oral Daily  . ondansetron (ZOFRAN) IV  4 mg Intravenous Q6H  . pantoprazole (PROTONIX) IV  40 mg Intravenous Q24H  . sodium chloride  3 mL Intravenous Q12H   Continuous Infusions: . sodium chloride 125 mL/hr at 04/24/14 1019  . dextrose 5 % and 0.45% NaCl      Time spent on care of this patient: 35 min   Solash Tullo, MD 04/24/2014, 1:02 PM  LOS: 2 days   [facility] Office  [phone] Pager - Text Page per Loretha StaplerAmion   If 7PM-7AM, please contact night-coverage [email or web address]

## 2014-04-24 NOTE — Consult Note (Signed)
[facility] Consult: 11:30 AM 04/24/2014  LOS: 2 days    Referring Provider: Dr Wynelle Cleveland.  Primary Care Physician:  None.  Only goes to local pain/methadone clinic located on [address] ([facility]?) Primary Gastroenterologist: unassigned   Reason for Consultation:  Acute pancreatitis.    HPI: Valerie Woodard is a 48 y.o. female.  Hx narcotics dependence to manage spine pain.  Followed nearly 3 year at local pain clinic for Methadone maintenance ( 160 mg once per day)    Presented to ED 2 days ago with 2 hours hx acute pain.  Started in mid to lower sternum, radiated to epigastrium which is where she still has intense pain.  Some nausea but mostly just "spitting up" clear and yellow bilious material.  Pain not radiating to back, arms, jaw but she is also c/o headache at present.  Unable to lay on back for exam due to pain.  Talking and deeper breathing is causing increased pain as well. No fever but some diaphoresis.  Initial Lipase of 283 two days ago, up to 631 one day ago, today 461. LFTs with Alk phos is 160s, transaminases 2x normal. Lipids abnormal with triglycerides in 430s. Ultrasound and CT abdomen show pancreatitis, 9 mm CBD (on CT only).  No sludge, stone in normal appearing GB.  ? Choledocholithiasis (per CT).  Both show incidental fatty liver.   Pt does not drink ETOH and never was heavy consumer.  Stable doses of once daily Methadone.  Repeated drug screens at pain clinic hav shown no ilicit drug use such that she is able to obtain 2 weeks worth of Methadone and take this on her own at home rather than having to go to clinic daily to get her meds.  Her issues were always with opiates. Occasionally uses Acetominophen prn for breakthrough pain.     REVIEW OF SYSTEMS: No PMD.  GI wise  has occasional AM heartburn and takes TUMS which helps her spit up clear, foamy material after which she feels fine. Last pelvic/pap smear > 20 years. Irregular menstruation occurring every few months, last was 4 months ago. No dyspnea.  No diarrhea.  No blood per rectum.  No previous EGD or colonoscopy. Weight gain of 70# in last 5 to 6 years though she denies excessive food/calorie intake and says she is active at her house: cleans, gardens.   Does trip on her little dog or rugs ("clumsy") and fall at least 2 times a month but denies balance or gait problems.  No LE edema.  No skin issues.  No dark urine.    Past Medical History  Diagnosis Date  . Opioid use disorder, severe, in sustained remission, on maintenance therapy 2013    on Methadone maintenance  . Vaginal delivery ~ 1993    dtr 48 yo as of 04/2014  . Chronic pain decades    mostly spinal  . Obesity 2015    230#, 104 kg, BMI 33 as of 04/2014    No previous surgeries.   Prior to Admission medications  Medication Sig Start Date End Date Taking? Authorizing Provider  diphenhydramine-acetaminophen (TYLENOL PM) 25-500 MG TABS Take 1 tablet by mouth daily as needed (for pain/sleep).   Yes Historical Provider, MD  METHADONE HCL PO Take 160 mg by mouth daily. Patient says she takes "160" from the methadone clinic, but was unable to answer any further questions or name the clinic.   Yes Historical Provider, MD    Scheduled Meds: . antiseptic oral rinse  15 mL Mouth Rinse BID  . enoxaparin (LOVENOX) injection  40 mg Subcutaneous Q24H  .  HYDROmorphone (DILAUDID) injection  1 mg Intravenous Q4H  . methadone  160 mg Oral Daily  . ondansetron (ZOFRAN) IV  4 mg Intravenous Q6H  . pantoprazole (PROTONIX) IV  40 mg Intravenous Q24H  . sodium chloride  3 mL Intravenous Q12H   Infusions: . sodium chloride 125 mL/hr at 04/24/14 1019   PRN Meds: promethazine   Allergies as of 04/22/2014  . (No Known Allergies)    History reviewed. No  pertinent family history.  History   Social History  . Marital Status: Divorced    Spouse Name: N/A    Number of Children: N/A  . Years of Education: N/A   Occupational History  . Not on file.   Social History Main Topics  . Smoking status: Current Every Day Smoker  . Smokeless tobacco: Not on file  . Alcohol Use: No  . Drug Use: No  . Sexual Activity: Not on file   Other Topics Concern  . Not on file   Social History Narrative  . No narrative on file      PHYSICAL EXAM: Vital signs in last 24 hours: Filed Vitals:   04/24/14 1004  BP: 143/83  Pulse: 93  Temp: 99 F (37.2 C)  Resp: 18   Wt Readings from Last 3 Encounters:  04/23/14 104.6 kg (230 lb 9.6 oz)  BMI 33.4                  General: Obese, uncomfortable WF who is in pain. Head:  No swelling or asymmetry  Eyes:  No icterus or pallor Ears:  Not HOH  Nose:  No congestion or discharge Mouth:  Clear, moist.   Neck:  No mass, no TMG, no bruit or JVD Lungs:  Clear but diminished bil due to poor inspiratory effort.  Heart: RRR.  No MRG Abdomen:  Refused to lay on back for abdominal exam "too painful".  Soft, obese, BS quiet.  Diffusely tender, > in epigastrium and upper guadrants.  No guard or rebound.   Rectal: deferred   Musc/Skeltl: no joint erythema or swelling Extremities:  No CCE.  Feet warm with brisk cap refill.   Neurologic:  Oriented x 3.  No tremor.  No limb weakness.  Skin:  No telangectasia, sores or rash Tattoos:  None seen.  Nodes:  No cervical adenopathy   Psych:  Cooperative to a degree but asked to stop interview as talking was making her pain worse.    Intake/Output from previous day:   Intake/Output this shift:    LAB RESULTS:  Recent Labs  04/22/14 2028 04/23/14 0243 04/24/14 0454  WBC 13.8* 16.7* 12.1*  HGB 14.1 13.5 12.7  HCT 42.9 40.4 39.7  PLT 395 415* 300   BMET Lab Results  Component Value Date   NA 137 04/24/2014   NA 136* 04/23/2014   NA 140 04/22/2014   K 3.8  04/24/2014   K 4.2 04/23/2014  K 4.2 04/22/2014   CL 95* 04/24/2014   CL 95* 04/23/2014   CL 98 04/22/2014   CO2 25 04/24/2014   CO2 22 04/23/2014   CO2 22 04/22/2014   GLUCOSE 111* 04/24/2014   GLUCOSE 162* 04/23/2014   GLUCOSE 139* 04/22/2014   BUN 8 04/24/2014   BUN 14 04/23/2014   BUN 14 04/22/2014   CREATININE 0.77 04/24/2014   CREATININE 0.82 04/23/2014   CREATININE 0.89 04/22/2014   CALCIUM 9.0 04/24/2014   CALCIUM 9.5 04/23/2014   CALCIUM 9.9 04/22/2014   LFT  Recent Labs  04/22/14 2244 04/23/14 0243  PROT 8.2 8.2  ALBUMIN 4.6 4.6  AST 63* 47*  ALT 77* 73*  ALKPHOS 167* 164*  BILITOT 0.4 0.4  BILIDIR <0.2  --   IBILI NOT CALCULATED  --    PT/INR Lab Results  Component Value Date   INR 0.99 04/23/2014   Hepatitis Panel No results found for this basename: HEPBSAG, HCVAB, HEPAIGM, HEPBIGM,  in the last 72 hours  Lipase     Component Value Date/Time   LIPASE 461* 04/24/2014 0454     Drugs of Abuse     Component Value Date/Time   LABOPIA POSITIVE* 04/23/2014 0348   COCAINSCRNUR NONE DETECTED 04/23/2014 0348   LABBENZ NONE DETECTED 04/23/2014 0348   AMPHETMU NONE DETECTED 04/23/2014 0348   THCU NONE DETECTED 04/23/2014 0348   LABBARB POSITIVE* 04/23/2014 0348     RADIOLOGY STUDIES: Dg Chest 2 View 04/22/2014   FINDINGS: The heart size and mediastinal contours are within normal limits. Both lungs are clear. The visualized skeletal structures are unremarkable.  IMPRESSION: No active cardiopulmonary disease.   Electronically Signed   By: Kerby Moors M.D.   On: 04/22/2014 21:14   US Abdomen Complete 04/23/2014    COMPARISON:  None.  FINDINGS: Gallbladder:  No gallstones or wall thickening visualized. No sonographic Murphy sign noted.  Common bile duct:  Diameter: 5.6 mm.  Liver:  No focal lesion identified.  Increased parenchymal echogenicity.  IVC:  No abnormality visualized.  Pancreas:  The pancreas appears inhomogeneous.  Spleen:  The spleen measures 13.5 cm in length.  Right Kidney:  Length: 13.4  cm. Echogenicity within normal limits. No mass or hydronephrosis visualized.  Left Kidney:  Length: 13.5 cm. Echogenicity within normal limits. No mass or hydronephrosis visualized.  Abdominal aorta:  No aneurysm visualized.  Other findings:  None.  IMPRESSION: 1. Hepatic steatosis 2. Edema of the pancreas consistent with a history of pancreatitis. 3. No gallstones identified.  No biliary dilatation.   Electronically Signed   By: Kerby Moors M.D.   On: 04/23/2014 00:32   Ct Abdomen Pelvis W Contrast 04/23/2014   CLINICAL DATA:  Acute pancreatitis, evaluate gallbladder  EXAM: CT ABDOMEN AND PELVIS WITH CONTRAST  TECHNIQUE: Multidetector CT imaging of the abdomen and pelvis was performed using the standard protocol following bolus administration of intravenous contrast.  CONTRAST:  133mL OMNIPAQUE IOHEXOL 300 MG/ML  SOLN  COMPARISON:  None.  FINDINGS: Calcified granuloma in the left lower lobe (series 22/ image 10).  Liver, spleen, and left adrenal gland are within normal limits.  Mild nodularity of the medial right adrenal gland (series 201/ image 24), technically indeterminate but likely reflecting a small adrenal adenoma.  Pancreas is notable for peripancreatic inflammatory changes, compatible with reported history of acute pancreatitis. No drainable fluid collection/ pseudocyst. No evidence of pancreatic necrosis.  Gallbladder is mildly distended but otherwise unremarkable. No calcified gallstones are evident on  CT. No intrahepatic ductal dilatation. Common duct is mildly dilated, measuring up to 9 mm, and choledocholithiasis is possible (series 201/ image 34); however, this appearance is equivocal, especially given the lack of gallstones on prior ultrasound.  Kidneys are within normal limits.  No hydronephrosis.  No evidence of bowel obstruction.  Normal appendix.  No evidence of abdominal aortic aneurysm.  No abdominopelvic ascites.  No suspicious abdominopelvic lymphadenopathy.  Uterus and bilateral ovaries  are unremarkable.  Bladder is within normal limits.  Visualized osseous structures are within normal limits.  IMPRESSION: Acute pancreatitis, without evidence of complication.  Common duct is dilated, measuring 9 mm. Possible choledocholithiasis, although equivocal. Consider ERCP or MRCP for further evaluation as clinically warranted.   Electronically Signed   By: Julian Hy M.D.   On: 04/23/2014 22:02    ENDOSCOPIC STUDIES: None ever  IMPRESSION:   *  Acute pancreatitis. ? Biliary?  No new meds.  No ETOH.  At current level of triglycerides, though elevated, would not expect these to be cause  *  Chronic back pain.  Previous addiction to percocet, stable use of Methadone from local pain clinic for nearly 3 years. Continuing on baseline dose of Methadone.   *  Fatty liver.   *  Dyslipidemia:  Elevated cholesterol/low HDL, triglycerides.  Triglycerides of 400s not likely high enough to cause pancreatitis.   *  Hyperglycemia.      PLAN:     *  MRCP?  At present doubt pt would lay still long enough for succesful study.   *  Increase fluids to 200 cc per hour.  Though glucose is moderately elevated, she still needs glucose in IVF so changing to D5 1/2 NS.  Repeat cmet and lipase in AM.   *  Given baseline large dose of daily Methadone, she may require more than 1 mg Dilaudid q 4 hours.     Azucena Freed  04/24/2014, 11:30 AM Pager: [phone]  GI ATTENDING  History, laboratories, x-rays reviewed. Patient personally seen and examined. Agree with H&P as above  IMPRESSION 1. Interstitial pancreatitis. Mild by Ranson criteria. Etiology unclear. Not ultrasound or CT demonstrated gallstones. Discrepancy in imaging studies regarding ductal diameter. My index of suspicion for choledocholithiasis is low. MRCP would be reasonable to more thoroughly evaluate her ductal anatomy, including ruling out pancreas divisum as a possible cause for pancreatitis. She is unable to sit still for such and  examined this point. 2. Chronic pain syndrome 3. Fatty liver on imaging. Likely cause for mild elevation of transaminases. No baseline to compare  RECOMMENDATIONS 1. Vigorous hydration 2. Pain control 3. N.p.o. 4. Was feeling better advance diet as tolerated to low fat 5. When able to cooperate (when her pain is at baseline) order MRCP. If abnormal, please contact us. In the interim, we are available as needed. Thank you  Docia Chuck. Geri Seminole., M.D. [facility] Division of Gastroenterology

## 2014-04-24 NOTE — Care Management Note (Signed)
CARE MANAGEMENT NOTE 04/24/2014  Patient:  Valerie Woodard,Valerie Woodard   Account Number:  [id number]  Date Initiated:  04/24/2014  Documentation initiated by:  Korena Nass  Subjective/Objective Assessment:   Consult for PCP for this pt.     Action/Plan:   Pt is uninsured, CM was able [facility] for Wednesday , May 07, 2014 @ 9am.   Anticipated DC Date:  04/26/2014   Anticipated DC Plan:  HOME/SELF CARE         Choice offered to / List presented to:             Status of service:  Completed, signed off Medicare Important Message given?   (If response is "NO", the following Medicare IM given date fields will be blank) Date Medicare IM given:   Medicare IM given by:   Date Additional Medicare IM given:   Additional Medicare IM given by:    Discharge Disposition:  HOME/SELF CARE  Per UR Regulation:    If discussed at Long Length of Stay Meetings, dates discussed:    Comments:

## 2014-04-25 ENCOUNTER — Inpatient Hospital Stay (HOSPITAL_COMMUNITY): Payer: Self-pay

## 2014-04-25 LAB — COMPREHENSIVE METABOLIC PANEL
ALK PHOS: 181 U/L — AB (ref 39–117)
ALT: 58 U/L — AB (ref 0–35)
AST: 40 U/L — AB (ref 0–37)
Albumin: 3.4 g/dL — ABNORMAL LOW (ref 3.5–5.2)
Anion gap: 16 — ABNORMAL HIGH (ref 5–15)
BUN: 5 mg/dL — ABNORMAL LOW (ref 6–23)
CO2: 24 meq/L (ref 19–32)
Calcium: 8.7 mg/dL (ref 8.4–10.5)
Chloride: 99 mEq/L (ref 96–112)
Creatinine, Ser: 0.71 mg/dL (ref 0.50–1.10)
GFR calc Af Amer: >90 mL/min (ref >90)
GFR calc non Af Amer: >90 mL/min (ref >90)
Glucose, Bld: 134 mg/dL — ABNORMAL HIGH (ref 70–99)
POTASSIUM: 3.6 meq/L — AB (ref 3.7–5.3)
SODIUM: 139 meq/L (ref 137–147)
TOTAL PROTEIN: 7.1 g/dL (ref 6.0–8.3)
Total Bilirubin: 0.7 mg/dL (ref 0.3–1.2)

## 2014-04-25 LAB — LIPASE, BLOOD: LIPASE: 146 U/L — AB (ref 11–59)

## 2014-04-25 LAB — HEMOGLOBIN A1C
Hgb A1c MFr Bld: 6.3 % — ABNORMAL HIGH (ref <5.7)
Mean Plasma Glucose: 134 mg/dL — ABNORMAL HIGH (ref <117)

## 2014-04-25 MED ORDER — LORAZEPAM 2 MG/ML IJ SOLN
0.5000 mg | Freq: Once | INTRAMUSCULAR | Status: AC | PRN
  Administered 2014-04-25: 0.5 mg via INTRAVENOUS
  Filled 2014-04-25: qty 1

## 2014-04-25 MED ORDER — NICOTINE 21 MG/24HR TD PT24
21.0000 mg | MEDICATED_PATCH | TRANSDERMAL | Status: DC
  Administered 2014-04-25 – 2014-04-26 (×2): 21 mg via TRANSDERMAL
  Filled 2014-04-25 (×4): qty 1

## 2014-04-25 MED ORDER — HYDROMORPHONE HCL PF 1 MG/ML IJ SOLN
1.0000 mg | INTRAMUSCULAR | Status: DC | PRN
  Administered 2014-04-25 – 2014-04-27 (×8): 1 mg via INTRAVENOUS
  Filled 2014-04-25 (×10): qty 1

## 2014-04-25 MED ORDER — GADOBENATE DIMEGLUMINE 529 MG/ML IV SOLN
20.0000 mL | Freq: Once | INTRAVENOUS | Status: AC | PRN
  Administered 2014-04-25: 20 mL via INTRAVENOUS

## 2014-04-25 MED ORDER — ONDANSETRON HCL 4 MG/2ML IJ SOLN
4.0000 mg | Freq: Four times a day (QID) | INTRAMUSCULAR | Status: DC | PRN
  Administered 2014-04-25 – 2014-04-27 (×3): 4 mg via INTRAVENOUS
  Filled 2014-04-25 (×4): qty 2

## 2014-04-25 MED ORDER — METHADONE HCL 10 MG/ML PO CONC
160.0000 mg | Freq: Every day | ORAL | Status: DC
  Administered 2014-04-25 – 2014-04-26 (×2): 160 mg via ORAL
  Filled 2014-04-25: qty 16

## 2014-04-25 MED ORDER — POTASSIUM CHLORIDE CRYS ER 20 MEQ PO TBCR
40.0000 meq | EXTENDED_RELEASE_TABLET | ORAL | Status: AC
  Administered 2014-04-25 (×2): 40 meq via ORAL
  Filled 2014-04-25 (×2): qty 2

## 2014-04-25 NOTE — Progress Notes (Signed)
Utilization review completed. Kaivon Livesey, RN, BSN. 

## 2014-04-25 NOTE — Progress Notes (Signed)
[facility] Progress Note   Valerie Woodard [id number] DOB: Aug 12, 1966 DOA: 04/22/2014 PCP: PROVIDER NOT IN SYSTEM  Brief narrative: Valerie LibraWendy Bieler is a 48 y.o. female  presents with epigastric pain and is found to have acute pancreatitis   Subjective: Nausea and epigastric pain improving. We discussed getting and MRI, changing Zofran and Dilaudid to PRN and starting clear liquids tonight after MRCP done- she is agreement with this plan.   Assessment/Plan: Principal Problem:   Acute pancreatitis - Ultrasound reveal pancreatiic inflammation-  CT reveals CBD of 9 mm- have asked for GI eval - Triglycerides not elevated enough to cause this- no offending medications -  Obtain MRCP today per GI recommendations - start fat free clears after MRCP and change Dilaudid and Zofran to PRN  Active Problems:    Chronic opiod dependence - on extremely high dose Methadone for back pain- does not have PCP and only goes to the Methadone clinic - goes to methadone clinic and is on high dose methadone- has been at current dose for 1 yr- cont Methadone- pt's family has brought in her home bottle - DO NOT want to overmedicate with narcotics while in hospital - will ask case management to find her a PCP- she does need further w/u of her back issues as this amount of Methadone is not ideal     Hepatic steatosis - advised slow weight loss  Mixed hyperlipidemia - dietician consult for low fat diet - will address medications once she is able to eat  Leukocytosis - likely stress demargination- no fevers follow  Mild hyperglycemia -  A1c 6.3- will advise weight loss and carb mod diet    Code Status: Full code Family Communication: none Disposition Plan: home  Consultants: none  Procedures: none  Antibiotics: Anti-infectives   None       DVT prophylaxis: Lovenox  Objective: Filed Weights   04/22/14 2020 04/23/14 0450 04/24/14 2031  Weight: 96.163 kg (212 lb) 104.6 kg (230 lb  9.6 oz) 104.599 kg (230 lb 9.6 oz)    Vitals Filed Vitals:   04/24/14 1843 04/24/14 2031 04/25/14 0440 04/25/14 0909  BP: 134/82 135/65 127/55 129/60  Pulse: 88 83 86 73  Temp: 97.8 F (36.6 C) 98 F (36.7 C) 99.4 F (37.4 C) 98.2 F (36.8 C)  TempSrc: Oral Oral Oral Oral  Resp: 18 18 18 20   Height:      Weight:  104.599 kg (230 lb 9.6 oz)    SpO2: 93% 94% 95% 95%      Intake/Output Summary (Last 24 hours) at 04/25/14 1514 Last data filed at 04/25/14 0440  Gross per 24 hour  Intake 1153.33 ml  Output      0 ml  Net 1153.33 ml     Exam: General: No acute respiratory distress Lungs: Clear to auscultation bilaterally without wheezes or crackles Cardiovascular: Regular rate and rhythm without murmur gallop or rub normal S1 and S2 Abdomen: tender in epigastrium, nondistended, soft, bowel sounds positive Extremities: No significant cyanosis, clubbing, or edema bilateral lower extremities  Data Reviewed: Basic Metabolic Panel:  Recent Labs Lab 04/22/14 2028 04/23/14 0243 04/24/14 0454 04/25/14 0500  NA 140 136* 137 139  K 4.2 4.2 3.8 3.6*  CL 98 95* 95* 99  CO2 22 22 25 24   GLUCOSE 139* 162* 111* 134*  BUN 14 14 8  5*  CREATININE 0.89 0.82 0.77 0.71  CALCIUM 9.9 9.5 9.0 8.7   Liver Function Tests:  Recent Labs Lab 04/22/14 2244 04/23/14  0243 04/25/14 0500  AST 63* 47* 40*  ALT 77* 73* 58*  ALKPHOS 167* 164* 181*  BILITOT 0.4 0.4 0.7  PROT 8.2 8.2 7.1  ALBUMIN 4.6 4.6 3.4*    Recent Labs Lab 04/22/14 2028 04/23/14 0243 04/24/14 0454 04/25/14 0500  LIPASE 283* 631* 461* 146*   No results found for this basename: AMMONIA,  in the last 168 hours CBC:  Recent Labs Lab 04/22/14 2028 04/23/14 0243 04/24/14 0454  WBC 13.8* 16.7* 12.1*  HGB 14.1 13.5 12.7  HCT 42.9 40.4 39.7  MCV 96.8 95.3 98.3  PLT 395 415* 300   Cardiac Enzymes:  Recent Labs Lab 04/22/14 2028 04/23/14 0243 04/23/14 0814  TROPONINI <0.30 <0.30 <0.30   BNP (last 3  results) No results found for this basename: PROBNP,  in the last 8760 hours CBG: No results found for this basename: GLUCAP,  in the last 168 hours  No results found for this or any previous visit (from the past 240 hour(s)).   Studies:  Recent x-ray studies have been reviewed in detail by the Attending Physician  Scheduled Meds:  Scheduled Meds: . antiseptic oral rinse  15 mL Mouth Rinse BID  . enoxaparin (LOVENOX) injection  40 mg Subcutaneous Q24H  . methadone  160 mg Oral Daily  . nicotine  21 mg Transdermal Q24H  . pantoprazole (PROTONIX) IV  40 mg Intravenous Q24H  . potassium chloride  40 mEq Oral Q4H  . sodium chloride  3 mL Intravenous Q12H   Continuous Infusions: . dextrose 5 % and 0.45% NaCl 200 mL/hr at 04/25/14 1120    Time spent on care of this patient: 35 min   Catarino Vold, MD 04/25/2014, 3:14 PM  LOS: 3 days   [facility] Office  [phone] Pager - Text Page per Loretha StaplerAmion   If 7PM-7AM, please contact night-coverage [email or web address]

## 2014-04-26 LAB — CBC
HCT: 33.1 % — ABNORMAL LOW (ref 36.0–46.0)
HEMOGLOBIN: 10.3 g/dL — AB (ref 12.0–15.0)
MCH: 30.8 pg (ref 26.0–34.0)
MCHC: 31.1 g/dL (ref 30.0–36.0)
MCV: 99.1 fL (ref 78.0–100.0)
Platelets: 289 10*3/uL (ref 150–400)
RBC: 3.34 MIL/uL — AB (ref 3.87–5.11)
RDW: 15.6 % — ABNORMAL HIGH (ref 11.5–15.5)
WBC: 12.1 10*3/uL — AB (ref 4.0–10.5)

## 2014-04-26 LAB — BASIC METABOLIC PANEL
ANION GAP: 14 (ref 5–15)
BUN: 4 mg/dL — ABNORMAL LOW (ref 6–23)
CHLORIDE: 99 meq/L (ref 96–112)
CO2: 26 mEq/L (ref 19–32)
Calcium: 8.9 mg/dL (ref 8.4–10.5)
Creatinine, Ser: 0.71 mg/dL (ref 0.50–1.10)
GFR calc non Af Amer: >90 mL/min (ref >90)
Glucose, Bld: 134 mg/dL — ABNORMAL HIGH (ref 70–99)
POTASSIUM: 4 meq/L (ref 3.7–5.3)
Sodium: 139 mEq/L (ref 137–147)

## 2014-04-26 LAB — LIPASE, BLOOD: LIPASE: 149 U/L — AB (ref 11–59)

## 2014-04-26 LAB — GLUCOSE, CAPILLARY: Glucose-Capillary: 136 mg/dL — ABNORMAL HIGH (ref 70–99)

## 2014-04-26 NOTE — Progress Notes (Signed)
[facility] Progress Note   Valerie LibraWendy Causey [id number] DOB: 01/05/1966 DOA: 04/22/2014 PCP: PROVIDER NOT IN SYSTEM  Brief narrative: Valerie Woodard is a 48 y.o. female  presents with epigastric pain and is found to have acute pancreatitis   Subjective: Pain and nausea cont to improve- tolerating clears   Assessment/Plan: Principal Problem:   Acute pancreatitis - Ultrasound reveal pancreatiic inflammation-  CT reveals CBD of 9 mm-  asked for GI eval -  Obtained MRCP per GI recommendations- CBD dilated but no stones- have discussed with Dr Marina GoodellPerry today (GI) who states no further work up is needed and we can continue to treat Pancreatis conservatively - Triglycerides not elevated enough to cause this- no offending medications - changed Dilaudid and Zofran to PRN- pt has been using both less in past 24 hrs - advance to full liquids today  Active Problems:    Chronic opiod dependence - on extremely high dose Methadone for back pain- does not have PCP and only goes to the Methadone clinic - goes to methadone clinic and is on high dose methadone- has been at current dose for 1 yr- cont Methadone- pt's family has brought in her home bottle - DO NOT want to overmedicate with narcotics while in hospital - will ask case management to find her a PCP- she does need further w/u of her back issues as this amount of Methadone is not ideal     Hepatic steatosis - advised slow weight loss  Mixed hyperlipidemia - dietician consult for low fat diet - will address medications once she is able to eat  Leukocytosis - likely stress demargination- no fevers follow  Mild hyperglycemia -  A1c 6.3- will advise weight loss and carb mod diet    Code Status: Full code Family Communication: none Disposition Plan: home  Consultants: none  Procedures: none  Antibiotics: Anti-infectives   None       DVT prophylaxis: Lovenox  Objective: Filed Weights   04/23/14 0450 04/24/14 2031  04/25/14 2037  Weight: 104.6 kg (230 lb 9.6 oz) 104.599 kg (230 lb 9.6 oz) 104.599 kg (230 lb 9.6 oz)    Vitals Filed Vitals:   04/25/14 1747 04/25/14 2037 04/26/14 0520 04/26/14 0937  BP: 136/68 112/62 135/59 142/72  Pulse: 86 86 95 87  Temp: 98.2 F (36.8 C) 98.6 F (37 C) 98.8 F (37.1 C) 98.4 F (36.9 C)  TempSrc: Axillary Oral Oral Oral  Resp:  20 20 20   Height:      Weight:  104.599 kg (230 lb 9.6 oz)    SpO2: 99% 95% 96% 96%      Intake/Output Summary (Last 24 hours) at 04/26/14 1227 Last data filed at 04/26/14 0900  Gross per 24 hour  Intake 1912.5 ml  Output      0 ml  Net 1912.5 ml     Exam: General: No acute respiratory distress Lungs: Clear to auscultation bilaterally without wheezes or crackles Cardiovascular: Regular rate and rhythm without murmur gallop or rub normal S1 and S2 Abdomen: less tender in epigastrium, nondistended, soft, bowel sounds positive Extremities: No significant cyanosis, clubbing, or edema bilateral lower extremities  Data Reviewed: Basic Metabolic Panel:  Recent Labs Lab 04/22/14 2028 04/23/14 0243 04/24/14 0454 04/25/14 0500 04/26/14 0730  NA 140 136* 137 139 139  K 4.2 4.2 3.8 3.6* 4.0  CL 98 95* 95* 99 99  CO2 22 22 25 24 26   GLUCOSE 139* 162* 111* 134* 134*  BUN 14 14 8  5*  4*  CREATININE 0.89 0.82 0.77 0.71 0.71  CALCIUM 9.9 9.5 9.0 8.7 8.9   Liver Function Tests:  Recent Labs Lab 04/22/14 2244 04/23/14 0243 04/25/14 0500  AST 63* 47* 40*  ALT 77* 73* 58*  ALKPHOS 167* 164* 181*  BILITOT 0.4 0.4 0.7  PROT 8.2 8.2 7.1  ALBUMIN 4.6 4.6 3.4*    Recent Labs Lab 04/22/14 2028 04/23/14 0243 04/24/14 0454 04/25/14 0500 04/26/14 0730  LIPASE 283* 631* 461* 146* 149*   No results found for this basename: AMMONIA,  in the last 168 hours CBC:  Recent Labs Lab 04/22/14 2028 04/23/14 0243 04/24/14 0454 04/26/14 0730  WBC 13.8* 16.7* 12.1* 12.1*  HGB 14.1 13.5 12.7 10.3*  HCT 42.9 40.4 39.7 33.1*   MCV 96.8 95.3 98.3 99.1  PLT 395 415* 300 289   Cardiac Enzymes:  Recent Labs Lab 04/22/14 2028 04/23/14 0243 04/23/14 0814  TROPONINI <0.30 <0.30 <0.30   BNP (last 3 results) No results found for this basename: PROBNP,  in the last 8760 hours CBG:  Recent Labs Lab 04/26/14 1136  GLUCAP 136*    No results found for this or any previous visit (from the past 240 hour(s)).   Studies:  Recent x-ray studies have been reviewed in detail by the Attending Physician  Scheduled Meds:  Scheduled Meds: . antiseptic oral rinse  15 mL Mouth Rinse BID  . enoxaparin (LOVENOX) injection  40 mg Subcutaneous Q24H  . methadone  160 mg Oral Daily  . nicotine  21 mg Transdermal Q24H  . pantoprazole (PROTONIX) IV  40 mg Intravenous Q24H  . sodium chloride  3 mL Intravenous Q12H   Continuous Infusions: . dextrose 5 % and 0.45% NaCl 125 mL/hr (04/26/14 0438)    Time spent on care of this patient: 35 min   Jacobi Ryant, MD 04/26/2014, 12:27 PM  LOS: 4 days   [facility] Office  [phone] Pager - Text Page per Loretha StaplerAmion   If 7PM-7AM, please contact night-coverage [email or web address]

## 2014-04-26 NOTE — Progress Notes (Signed)
Patient refused bed alarm.    

## 2014-04-27 ENCOUNTER — Encounter (HOSPITAL_COMMUNITY): Payer: Self-pay | Admitting: Internal Medicine

## 2014-04-27 DIAGNOSIS — E785 Hyperlipidemia, unspecified: Secondary | ICD-10-CM | POA: Diagnosis present

## 2014-04-27 DIAGNOSIS — E8881 Metabolic syndrome: Secondary | ICD-10-CM

## 2014-04-27 DIAGNOSIS — K838 Other specified diseases of biliary tract: Secondary | ICD-10-CM | POA: Diagnosis present

## 2014-04-27 DIAGNOSIS — E119 Type 2 diabetes mellitus without complications: Secondary | ICD-10-CM | POA: Diagnosis present

## 2014-04-27 HISTORY — DX: Type 2 diabetes mellitus without complications: E11.9

## 2014-04-27 MED ORDER — ACETAMINOPHEN 325 MG PO TABS: 650.0000 mg | ORAL_TABLET | ORAL | Status: DC | PRN

## 2014-04-27 MED ORDER — HYDROMORPHONE HCL PF 1 MG/ML IJ SOLN
1.0000 mg | INTRAMUSCULAR | Status: DC | PRN
  Administered 2014-04-27: 1 mg via INTRAMUSCULAR

## 2014-04-27 MED ORDER — GLUCERNA SHAKE PO LIQD
237.0000 mL | Freq: Three times a day (TID) | ORAL | Status: DC
  Administered 2014-04-27: 237 mL via ORAL

## 2014-04-27 NOTE — Discharge Summary (Signed)
Physician Discharge Summary  Valerie Woodard ZOX:[id number] DOB: [birth year] DOA: 04/22/2014  PCP: PROVIDER NOT IN SYSTEM  Admit date: 04/22/2014 Discharge date: 04/27/2014  Time spent: >45 minutes   Discharge Diagnoses:  Principal Problem:   Acute pancreatitis Active Problems:   Chest pain   Chronic pain syndrome   Hepatic steatosis   Dilated cbd, acquired Metabolic syndrome Hyperlipidemia DM  HTN   Discharge Condition: stable  Diet recommendation: low fat, low carb diet- discussed weight loss and fatty liver in detail with patient  Filed Weights   04/23/14 0450 04/24/14 2031 04/25/14 2037  Weight: 104.6 kg (230 lb 9.6 oz) 104.599 kg (230 lb 9.6 oz) 104.599 kg (230 lb 9.6 oz)    History of present illness:  Valerie Woodard is a 48 y.o. female with Past medical history of substance abuse and chronic pain.  Patient presented with complaints of chest pain. She mentions that she has on and off chest pain since last 2 years occurring every couple of month. Last episode was 2 months ago. She does not have any shortness of breath at her baseline. Since last 2 days she has been having chest pain which is more located in her epigastric region. The pain is burning and sharp and radiating to her back. She denies any fever or chills, shortness of breath, cough, diarrhea, leg swelling, burning urination, orthopnea, PND.  She complains of nausea and vomiting with 6-7 episodes of vomiting and then multiple episodes of dry heaving.  She mentions she also stool chart [facility] pain management center and gets methadone 160 mg daily since last 2 years.  She denies any alcohol abuse she denies any prior abdominal pain with fatty meal, she denies any smoking history she denies any drug abuse.  The patient is coming from home. And at her baseline independent for most of her ADL.   Hospital Course:  Principal Problem:  Acute pancreatitis  - Ultrasound reveal pancreatiic inflammation- CT reveals CBD of 9 mm-  asked for GI eval  - Obtained MRCP per GI recommendations- CBD dilated but no stones- have discussed with Dr Marina GoodellPerry  (GI) who states no further work up is needed and we can continue to treat Pancreatis conservatively  - Triglycerides not elevated enough to cause this- no offending medications  -has been steadily advanced to solids and is tolerating solids now- recommended low fat diet  Active Problems:  Chronic opiod dependence  - on extremely high dose Methadone for back pain- does not have PCP and only goes to the Methadone clinic  - goes to methadone clinic and is on high dose methadone- has been at current dose for 1 yr- cont Methadone- pt's family has brought in her home bottle  - DO NOT want to overmedicate with narcotics while in hospital  - have asked case management to find her a PCP- she does need further w/u of her back issues as this amount of Methadone is not ideal   Hepatic steatosis  - advised slow weight loss    DM  - A1c 6.3- will advise weight loss and carb mod diet   Mixed hyperlipidemia  - dietician consult for low fat diet  - recheck lipids in 3 months  Leukocytosis  - likely stress demargination- no fevers  Metabolic syndrome - DM, hyperlip, HTN, obesity - needs to f/u with health and wellness clinic   Procedures:  none  Consultations:  GI- Dr Marina GoodellPerry  Discharge Exam: Filed Vitals:   04/27/14 1054  BP: 143/79  Pulse: 73  Temp: 98.5 F (36.9 C)  Resp: 19   General: No acute respiratory distress  Lungs: Clear to auscultation bilaterally without wheezes or crackles  Cardiovascular: Regular rate and rhythm without murmur gallop or rub normal S1 and S2  Abdomen: non-tender in epigastrium, nondistended, soft, bowel sounds positive  Extremities: No significant cyanosis, clubbing, or edema bilateral lower extremities   Discharge Instructions You were cared for by a hospitalist during your hospital stay. If you have any questions about your discharge  medications or the care you received while you were in the hospital after you are discharged, you can call the unit and asked to speak with the hospitalist on call if the hospitalist that took care of you is not available. Once you are discharged, your primary care physician will handle any further medical issues. Please note that NO REFILLS for any discharge medications will be authorized once you are discharged, as it is imperative that you return to your primary care physician (or establish a relationship with a primary care physician if you do not have one) for your aftercare needs so that they can reassess your need for medications and monitor your lab values.      Discharge Instructions   Diet - low sodium heart healthy    Complete by:  As directed   Low fat diet, low sodium and low carbohydrates     Discharge instructions    Complete by:  As directed   You were found to have high blood pressure, high cholesterol and high sugars.  We have made you and appointment at the health and wellness clinic as you do not have a family doctor.     Increase activity slowly    Complete by:  As directed             Medication List         diphenhydramine-acetaminophen 25-500 MG Tabs  Commonly known as:  TYLENOL PM  Take 1 tablet by mouth daily as needed (for pain/sleep).     METHADONE HCL PO  Take 160 mg by mouth daily. Patient says she takes "160" from the methadone clinic, but was unable to answer any further questions or name the clinic.       No Known Allergies Follow-up Information   Follow up with [facility]    . (You have an appointment for Wednesday , May 07, 2014 @ 9am. )    Contact information:   [address] [place] [address] [phone]       The results of significant diagnostics from this hospitalization (including imaging, microbiology, ancillary and laboratory) are listed below for reference.    Significant Diagnostic  Studies: Dg Chest 2 View  04/22/2014   CLINICAL DATA:  Chest pain  EXAM: CHEST  2 VIEW  COMPARISON:  None.  FINDINGS: The heart size and mediastinal contours are within normal limits. Both lungs are clear. The visualized skeletal structures are unremarkable.  IMPRESSION: No active cardiopulmonary disease.   Electronically Signed   By: Signa Kell M.D.   On: 04/22/2014 21:14   US Abdomen Complete  04/23/2014   CLINICAL DATA:  Abdominal pain and emesis.  Pancreatitis.  EXAM: ULTRASOUND ABDOMEN COMPLETE  COMPARISON:  None.  FINDINGS: Gallbladder:  No gallstones or wall thickening visualized. No sonographic Murphy sign noted.  Common bile duct:  Diameter: 5.6 mm.  Liver:  No focal lesion identified.  Increased parenchymal echogenicity.  IVC:  No abnormality  visualized.  Pancreas:  The pancreas appears inhomogeneous.  Spleen:  The spleen measures 13.5 cm in length.  Right Kidney:  Length: 13.4 cm. Echogenicity within normal limits. No mass or hydronephrosis visualized.  Left Kidney:  Length: 13.5 cm. Echogenicity within normal limits. No mass or hydronephrosis visualized.  Abdominal aorta:  No aneurysm visualized.  Other findings:  None.  IMPRESSION: 1. Hepatic steatosis 2. Edema of the pancreas consistent with a history of pancreatitis. 3. No gallstones identified.  No biliary dilatation.   Electronically Signed   By: Signa Kellaylor  Stroud M.D.   On: 04/23/2014 00:32   Ct Abdomen Pelvis W Contrast  04/23/2014   CLINICAL DATA:  Acute pancreatitis, evaluate gallbladder  EXAM: CT ABDOMEN AND PELVIS WITH CONTRAST  TECHNIQUE: Multidetector CT imaging of the abdomen and pelvis was performed using the standard protocol following bolus administration of intravenous contrast.  CONTRAST:  100mL OMNIPAQUE IOHEXOL 300 MG/ML  SOLN  COMPARISON:  None.  FINDINGS: Calcified granuloma in the left lower lobe (series 22/ image 10).  Liver, spleen, and left adrenal gland are within normal limits.  Mild nodularity of the medial right  adrenal gland (series 201/ image 24), technically indeterminate but likely reflecting a small adrenal adenoma.  Pancreas is notable for peripancreatic inflammatory changes, compatible with reported history of acute pancreatitis. No drainable fluid collection/ pseudocyst. No evidence of pancreatic necrosis.  Gallbladder is mildly distended but otherwise unremarkable. No calcified gallstones are evident on CT. No intrahepatic ductal dilatation. Common duct is mildly dilated, measuring up to 9 mm, and choledocholithiasis is possible (series 201/ image 34); however, this appearance is equivocal, especially given the lack of gallstones on prior ultrasound.  Kidneys are within normal limits.  No hydronephrosis.  No evidence of bowel obstruction.  Normal appendix.  No evidence of abdominal aortic aneurysm.  No abdominopelvic ascites.  No suspicious abdominopelvic lymphadenopathy.  Uterus and bilateral ovaries are unremarkable.  Bladder is within normal limits.  Visualized osseous structures are within normal limits.  IMPRESSION: Acute pancreatitis, without evidence of complication.  Common duct is dilated, measuring 9 mm. Possible choledocholithiasis, although equivocal. Consider ERCP or MRCP for further evaluation as clinically warranted.   Electronically Signed   By: Charline BillsSriyesh  Krishnan M.D.   On: 04/23/2014 22:02   Mr 3d Recon At Scanner  04/25/2014   CLINICAL DATA:  Acute pancreatitis, evaluate for gallstones  EXAM: MRI ABDOMEN WITHOUT AND WITH CONTRAST (INCLUDING MRCP)  TECHNIQUE: Multiplanar multisequence MR imaging of the abdomen was performed both before and after the administration of intravenous contrast. Heavily T2-weighted images of the biliary and pancreatic ducts were obtained, and three-dimensional MRCP images were rendered by post processing.  CONTRAST:  20mL MULTIHANCE GADOBENATE DIMEGLUMINE 529 MG/ML IV SOLN  COMPARISON:  CT abdomen pelvis dated 04/23/2013. Ultrasound abdomen dated 04/22/2014.  FINDINGS:  Motion degraded images.  Liver is notable for mild hepatic steatosis. No suspicious/enhancing hepatic lesions.  Spleen and adrenal glands are within normal limits.  Peripancreatic inflammatory changes, predominant along the body/tail (series 6/ image 22), compatible with acute pancreatitis. No drainable fluid collection/pseudocyst.  Gallbladder is mildly distended. No evidence of gallstones. No intrahepatic or extrahepatic ductal dilatation. Common duct is mildly prominent, measuring 9 mm in its midportion (series 3/ image 21), but no choledocholithiasis is seen.  Kidneys are within normal limits.  No hydronephrosis.  Trace perihepatic ascites.  No abdominal lymphadenopathy.  No focal osseous lesions.  IMPRESSION: Acute pancreatitis.  No drainable fluid collection/pseudocyst.  Common duct is mildly prominent, measuring  9 mm in its midportion. No cholelithiasis or choledocholithiasis is seen.  Mild hepatic steatosis.   Electronically Signed   By: Charline Bills M.D.   On: 04/25/2014 19:23   Mr Abd W/wo Cm/mrcp  04/25/2014   CLINICAL DATA:  Acute pancreatitis, evaluate for gallstones  EXAM: MRI ABDOMEN WITHOUT AND WITH CONTRAST (INCLUDING MRCP)  TECHNIQUE: Multiplanar multisequence MR imaging of the abdomen was performed both before and after the administration of intravenous contrast. Heavily T2-weighted images of the biliary and pancreatic ducts were obtained, and three-dimensional MRCP images were rendered by post processing.  CONTRAST:  20mL MULTIHANCE GADOBENATE DIMEGLUMINE 529 MG/ML IV SOLN  COMPARISON:  CT abdomen pelvis dated 04/23/2013. Ultrasound abdomen dated 04/22/2014.  FINDINGS: Motion degraded images.  Liver is notable for mild hepatic steatosis. No suspicious/enhancing hepatic lesions.  Spleen and adrenal glands are within normal limits.  Peripancreatic inflammatory changes, predominant along the body/tail (series 6/ image 22), compatible with acute pancreatitis. No drainable fluid  collection/pseudocyst.  Gallbladder is mildly distended. No evidence of gallstones. No intrahepatic or extrahepatic ductal dilatation. Common duct is mildly prominent, measuring 9 mm in its midportion (series 3/ image 21), but no choledocholithiasis is seen.  Kidneys are within normal limits.  No hydronephrosis.  Trace perihepatic ascites.  No abdominal lymphadenopathy.  No focal osseous lesions.  IMPRESSION: Acute pancreatitis.  No drainable fluid collection/pseudocyst.  Common duct is mildly prominent, measuring 9 mm in its midportion. No cholelithiasis or choledocholithiasis is seen.  Mild hepatic steatosis.   Electronically Signed   By: Charline Bills M.D.   On: 04/25/2014 19:23    Microbiology: No results found for this or any previous visit (from the past 240 hour(s)).   Labs: Basic Metabolic Panel:  Recent Labs Lab 04/22/14 2028 04/23/14 0243 04/24/14 0454 04/25/14 0500 04/26/14 0730  NA 140 136* 137 139 139  K 4.2 4.2 3.8 3.6* 4.0  CL 98 95* 95* 99 99  CO2 22 22 25 24 26   GLUCOSE 139* 162* 111* 134* 134*  BUN 14 14 8  5* 4*  CREATININE 0.89 0.82 0.77 0.71 0.71  CALCIUM 9.9 9.5 9.0 8.7 8.9   Liver Function Tests:  Recent Labs Lab 04/22/14 2244 04/23/14 0243 04/25/14 0500  AST 63* 47* 40*  ALT 77* 73* 58*  ALKPHOS 167* 164* 181*  BILITOT 0.4 0.4 0.7  PROT 8.2 8.2 7.1  ALBUMIN 4.6 4.6 3.4*    Recent Labs Lab 04/22/14 2028 04/23/14 0243 04/24/14 0454 04/25/14 0500 04/26/14 0730  LIPASE 283* 631* 461* 146* 149*   No results found for this basename: AMMONIA,  in the last 168 hours CBC:  Recent Labs Lab 04/22/14 2028 04/23/14 0243 04/24/14 0454 04/26/14 0730  WBC 13.8* 16.7* 12.1* 12.1*  HGB 14.1 13.5 12.7 10.3*  HCT 42.9 40.4 39.7 33.1*  MCV 96.8 95.3 98.3 99.1  PLT 395 415* 300 289   Cardiac Enzymes:  Recent Labs Lab 04/22/14 2028 04/23/14 0243 04/23/14 0814  TROPONINI <0.30 <0.30 <0.30   BNP: BNP (last 3 results) No results found for  this basename: PROBNP,  in the last 8760 hours CBG:  Recent Labs Lab 04/26/14 1136  GLUCAP 136*       SignedCalvert Cantor, MD [facility] 04/27/2014, 2:37 PM

## 2014-04-27 NOTE — Discharge Instructions (Signed)
You were found to have high blood pressure, high cholesterol and high sugars.  We have made you and appointment at the health and wellness clinic as you do not have a family doctor.  Fatty Liver Fatty liver is the accumulation of fat in liver cells. It is also called hepatosteatosis or steatohepatitis. It is normal for your liver to contain some fat. If fat is more than 5 to 10% of your liver's weight, you have fatty liver.  There are often no symptoms (problems) for years while damage is still occurring. People often learn about their fatty liver when they have medical tests for other reasons. Fat can damage your liver for years or even decades without causing problems. When it becomes severe, it can cause fatigue, weight loss, weakness, and confusion. This makes you more likely to develop more serious liver problems. The liver is the largest organ in the body. It does a lot of work and often gives no warning signs when it is sick until late in a disease. The liver has many important jobs including:  Breaking down foods.  Storing vitamins, iron, and other minerals.  Making proteins.  Making bile for food digestion.  Breaking down many products including medications, alcohol and some poisons. CAUSES  There are a number of different conditions, medications, and poisons that can cause a fatty liver. Eating too many calories causes fat to build up in the liver. Not processing and breaking fats down normally may also cause this. Certain conditions, such as obesity, diabetes, and high triglycerides also cause this. Most fatty liver patients tend to be middle-aged and over weight.  Some causes of fatty liver are:  Alcohol over consumption.  Malnutrition.  Steroid use.  Valproic acid toxicity.  Obesity.  Cushing's syndrome.  Poisons.  Tetracycline in high dosages.  Pregnancy.  Diabetes.  Hyperlipidemia.  Rapid weight loss. Some people develop fatty liver even having none of  these conditions. SYMPTOMS  Fatty liver most often causes no problems. This is called asymptomatic.  It can be diagnosed with blood tests and also by a liver biopsy.  It is one of the most common causes of minor elevations of liver enzymes on routine blood tests.  Specialized Imaging of the liver using ultrasound, CT (computed tomography) scan, or MRI (magnetic resonance imaging) can suggest a fatty liver but a biopsy is needed to confirm it.  A biopsy involves taking a small sample of liver tissue. This is done by using a needle. It is then looked at under a microscope by a specialist. TREATMENT  It is important to treat the cause. Simple fatty liver without a medical reason may not need treatment.  Weight loss, fat restriction, and exercise in overweight patients produces inconsistent results but is worth trying.  Fatty liver due to alcohol toxicity may not improve even with stopping drinking.  Good control of diabetes may reduce fatty liver.  Lower your triglycerides through diet, medication or both.  Eat a balanced, healthy diet.  Increase your physical activity.  Get regular checkups from a liver specialist.  There are no medical or surgical treatments for a fatty liver or NASH, but improving your diet and increasing your exercise may help prevent or reverse some of the damage. PROGNOSIS  Fatty liver may cause no damage or it can lead to an inflammation of the liver. This is, called steatohepatitis. When it is linked to alcohol abuse, it is called alcoholic steatohepatitis. It often is not linked to alcohol. It is then  called nonalcoholic steatohepatitis, or NASH. Over time the liver may become scarred and hardened. This condition is called cirrhosis. Cirrhosis is serious and may lead to liver failure or cancer. NASH is one of the leading causes of cirrhosis. About 10-20% of Americans have fatty liver and a smaller 2-5% has NASH. Document Released: 11/25/2005 Document Revised:  01/02/2012 Document Reviewed: 01/18/2006 [facility] Patient Information 2015 [place], [place]. This information is not intended to replace advice given to you by your health care provider. Make sure you discuss any questions you have with your health care provider.'   Acute Pancreatitis Acute pancreatitis is a disease in which the pancreas becomes suddenly inflamed. The pancreas is a large gland located behind your stomach. The pancreas produces enzymes that help digest food. The pancreas also releases the hormones glucagon and insulin that help regulate blood sugar. Damage to the pancreas occurs when the digestive enzymes from the pancreas are activated and begin attacking the pancreas before being released into the intestine. Most acute attacks last a couple of days and can cause serious complications. Some people become dehydrated and develop low blood pressure. In severe cases, bleeding into the pancreas can lead to shock and can be life-threatening. The lungs, heart, and kidneys may fail. CAUSES  Pancreatitis can happen to anyone. In some cases, the cause is unknown. Most cases are caused by:  Alcohol abuse.  Gallstones. Other less common causes are:  Certain medicines.  Exposure to certain chemicals.  Infection.  Damage caused by an accident (trauma).  Abdominal surgery. SYMPTOMS   Pain in the upper abdomen that may radiate to the back.  Tenderness and swelling of the abdomen.  Nausea and vomiting. DIAGNOSIS  Your caregiver will perform a physical exam. Blood and stool tests may be done to confirm the diagnosis. Imaging tests may also be done, such as X-rays, CT scans, or an ultrasound of the abdomen. TREATMENT  Treatment usually requires a stay in the hospital. Treatment may include:  Pain medicine.  Fluid replacement through an intravenous line (IV).  Placing a tube in the stomach to remove stomach contents and control vomiting.  Not eating for 3 or 4 days. This gives  your pancreas a rest, because enzymes are not being produced that can cause further damage.  Antibiotic medicines if your condition is caused by an infection.  Surgery of the pancreas or gallbladder. HOME CARE INSTRUCTIONS   Follow the diet advised by your caregiver. This may involve avoiding alcohol and decreasing the amount of fat in your diet.  Eat smaller, more frequent meals. This reduces the amount of digestive juices the pancreas produces.  Drink enough fluids to keep your urine clear or pale yellow.  Only take over-the-counter or prescription medicines as directed by your caregiver.  Avoid drinking alcohol if it caused your condition.  Do not smoke.  Get plenty of rest.  Check your blood sugar at home as directed by your caregiver.  Keep all follow-up appointments as directed by your caregiver. SEEK MEDICAL CARE IF:   You do not recover as quickly as expected.  You develop new or worsening symptoms.  You have persistent pain, weakness, or nausea.  You recover and then have another episode of pain. SEEK IMMEDIATE MEDICAL CARE IF:   You are unable to eat or keep fluids down.  Your pain becomes severe.  You have a fever or persistent symptoms for more than 2 to 3 days.  You have a fever and your symptoms suddenly get worse.  Your skin or the white part of your eyes turn yellow (jaundice).  You develop vomiting.  You feel dizzy, or you faint.  Your blood sugar is high (over 300 mg/dL). MAKE SURE YOU:   Understand these instructions.  Will watch your condition.  Will get help right away if you are not doing well or get worse. Document Released: 10/10/2005 Document Revised: 04/10/2012 Document Reviewed: 01/19/2012 [facility] Patient Information 2015 [place]. This information is not intended to replace advice given to you by your health care provider. Make sure you discuss any questions you have with your health care provider.  Fat and Cholesterol  Control Diet Fat and cholesterol levels in your blood and organs are influenced by your diet. High levels of fat and cholesterol may lead to diseases of the heart, small and large blood vessels, gallbladder, liver, and pancreas. CONTROLLING FAT AND CHOLESTEROL WITH DIET Although exercise and lifestyle factors are important, your diet is key. That is because certain foods are known to raise cholesterol and others to lower it. The goal is to balance foods for their effect on cholesterol and more importantly, to replace saturated and trans fat with other types of fat, such as monounsaturated fat, polyunsaturated fat, and omega-3 fatty acids. On average, a person should consume no more than 15 to 17 g of saturated fat daily. Saturated and trans fats are considered "bad" fats, and they will raise LDL cholesterol. Saturated fats are primarily found in animal products such as meats, butter, and cream. However, that does not mean you need to give up all your favorite foods. Today, there are good tasting, low-fat, low-cholesterol substitutes for most of the things you like to eat. Choose low-fat or nonfat alternatives. Choose round or loin cuts of red meat. These types of cuts are lowest in fat and cholesterol. Chicken (without the skin), fish, veal, and ground [place] breast are great choices. Eliminate fatty meats, such as hot dogs and salami. Even shellfish have little or no saturated fat. Have a 3 oz (85 g) portion when you eat lean meat, poultry, or fish. Trans fats are also called "partially hydrogenated oils." They are oils that have been scientifically manipulated so that they are solid at room temperature resulting in a longer shelf life and improved taste and texture of foods in which they are added. Trans fats are found in stick margarine, some tub margarines, cookies, crackers, and baked goods.  When baking and cooking, oils are a great substitute for butter. The monounsaturated oils are especially  beneficial since it is believed they lower LDL and raise HDL. The oils you should avoid entirely are saturated tropical oils, such as coconut and palm.  Remember to eat a lot from food groups that are naturally free of saturated and trans fat, including fish, fruit, vegetables, beans, grains (barley, rice, couscous, bulgur wheat), and pasta (without cream sauces).  IDENTIFYING FOODS THAT LOWER FAT AND CHOLESTEROL  Soluble fiber may lower your cholesterol. This type of fiber is found in fruits such as apples, vegetables such as broccoli, potatoes, and carrots, legumes such as beans, peas, and lentils, and grains such as barley. Foods fortified with plant sterols (phytosterol) may also lower cholesterol. You should eat at least 2 g per day of these foods for a cholesterol lowering effect.  Read package labels to identify low-saturated fats, trans fat free, and low-fat foods at the supermarket. Select cheeses that have only 2 to 3 g saturated fat per ounce. Use a heart-healthy tub margarine  that is free of trans fats or partially hydrogenated oil. When buying baked goods (cookies, crackers), avoid partially hydrogenated oils. Breads and muffins should be made from whole grains (whole-wheat or whole oat flour, instead of "flour" or "enriched flour"). Buy non-creamy canned soups with reduced salt and no added fats.  FOOD PREPARATION TECHNIQUES  Never deep-fry. If you must fry, either stir-fry, which uses very little fat, or use non-stick cooking sprays. When possible, broil, bake, or roast meats, and steam vegetables. Instead of putting butter or margarine on vegetables, use lemon and herbs, applesauce, and cinnamon (for squash and sweet potatoes). Use nonfat yogurt, salsa, and low-fat dressings for salads.  LOW-SATURATED FAT / LOW-FAT FOOD SUBSTITUTES Meats / Saturated Fat (g)  Avoid: Steak, marbled (3 oz/85 g) / 11 g  Choose: Steak, lean (3 oz/85 g) / 4 g  Avoid: Hamburger (3 oz/85 g) / 7 g  Choose:  Hamburger, lean (3 oz/85 g) / 5 g  Avoid: Ham (3 oz/85 g) / 6 g  Choose: Ham, lean cut (3 oz/85 g) / 2.4 g  Avoid: Chicken, with skin, dark meat (3 oz/85 g) / 4 g  Choose: Chicken, skin removed, dark meat (3 oz/85 g) / 2 g  Avoid: Chicken, with skin, light meat (3 oz/85 g) / 2.5 g  Choose: Chicken, skin removed, light meat (3 oz/85 g) / 1 g Dairy / Saturated Fat (g)  Avoid: Whole milk (1 cup) / 5 g  Choose: Low-fat milk, 2% (1 cup) / 3 g  Choose: Low-fat milk, 1% (1 cup) / 1.5 g  Choose: Skim milk (1 cup) / 0.3 g  Avoid: Hard cheese (1 oz/28 g) / 6 g  Choose: Skim milk cheese (1 oz/28 g) / 2 to 3 g  Avoid: Cottage cheese, 4% fat (1 cup) / 6.5 g  Choose: Low-fat cottage cheese, 1% fat (1 cup) / 1.5 g  Avoid: Ice cream (1 cup) / 9 g  Choose: Sherbet (1 cup) / 2.5 g  Choose: Nonfat frozen yogurt (1 cup) / 0.3 g  Choose: Frozen fruit bar / trace  Avoid: Whipped cream (1 tbs) / 3.5 g  Choose: Nondairy whipped topping (1 tbs) / 1 g Condiments / Saturated Fat (g)  Avoid: Mayonnaise (1 tbs) / 2 g  Choose: Low-fat mayonnaise (1 tbs) / 1 g  Avoid: Butter (1 tbs) / 7 g  Choose: Extra light margarine (1 tbs) / 1 g  Avoid: Coconut oil (1 tbs) / 11.8 g  Choose: Olive oil (1 tbs) / 1.8 g  Choose: Corn oil (1 tbs) / 1.7 g  Choose: Safflower oil (1 tbs) / 1.2 g  Choose: Sunflower oil (1 tbs) / 1.4 g  Choose: Soybean oil (1 tbs) / 2.4 g  Choose: Canola oil (1 tbs) / 1 g Document Released: 10/10/2005 Document Revised: 02/04/2013 Document Reviewed: 03/31/2011 [employer] Patient Information 2015 [place], [place]. This information is not intended to replace advice given to you by your health care provider. Make sure you discuss any questions you have with your health care provider.

## 2014-05-07 ENCOUNTER — Ambulatory Visit: Payer: Self-pay | Attending: Internal Medicine | Admitting: Internal Medicine

## 2014-05-07 ENCOUNTER — Encounter: Payer: Self-pay | Admitting: Internal Medicine

## 2014-05-07 VITALS — BP 149/82 | HR 74 | Temp 98.3°F | Resp 16 | Ht 67.0 in | Wt 231.8 lb

## 2014-05-07 DIAGNOSIS — G8929 Other chronic pain: Secondary | ICD-10-CM | POA: Insufficient documentation

## 2014-05-07 DIAGNOSIS — K859 Acute pancreatitis without necrosis or infection, unspecified: Secondary | ICD-10-CM | POA: Insufficient documentation

## 2014-05-07 DIAGNOSIS — K8500 Idiopathic acute pancreatitis without necrosis or infection: Secondary | ICD-10-CM

## 2014-05-07 DIAGNOSIS — E785 Hyperlipidemia, unspecified: Secondary | ICD-10-CM | POA: Insufficient documentation

## 2014-05-07 DIAGNOSIS — Z6833 Body mass index (BMI) 33.0-33.9, adult: Secondary | ICD-10-CM | POA: Insufficient documentation

## 2014-05-07 DIAGNOSIS — Z79899 Other long term (current) drug therapy: Secondary | ICD-10-CM | POA: Insufficient documentation

## 2014-05-07 DIAGNOSIS — E119 Type 2 diabetes mellitus without complications: Secondary | ICD-10-CM | POA: Insufficient documentation

## 2014-05-07 DIAGNOSIS — F17200 Nicotine dependence, unspecified, uncomplicated: Secondary | ICD-10-CM | POA: Insufficient documentation

## 2014-05-07 DIAGNOSIS — F1111 Opioid abuse, in remission: Secondary | ICD-10-CM | POA: Insufficient documentation

## 2014-05-07 DIAGNOSIS — E669 Obesity, unspecified: Secondary | ICD-10-CM | POA: Insufficient documentation

## 2014-05-07 LAB — LIPASE: Lipase: 89 U/L — ABNORMAL HIGH (ref 0–75)

## 2014-05-07 LAB — COMPREHENSIVE METABOLIC PANEL
ALK PHOS: 136 U/L — AB (ref 39–117)
ALT: 16 U/L (ref 0–35)
AST: 23 U/L (ref 0–37)
Albumin: 4.3 g/dL (ref 3.5–5.2)
BILIRUBIN TOTAL: 0.3 mg/dL (ref 0.2–1.2)
BUN: 11 mg/dL (ref 6–23)
CO2: 25 mEq/L (ref 19–32)
Calcium: 9.2 mg/dL (ref 8.4–10.5)
Chloride: 102 mEq/L (ref 96–112)
Creat: 0.99 mg/dL (ref 0.50–1.10)
GLUCOSE: 101 mg/dL — AB (ref 70–99)
Potassium: 4.1 mEq/L (ref 3.5–5.3)
Sodium: 137 mEq/L (ref 135–145)
Total Protein: 6.8 g/dL (ref 6.0–8.3)

## 2014-05-07 NOTE — Progress Notes (Signed)
Patient ID: Valerie Woodard, female   DOB: 1966/06/09, 48 y.o.   MRN: [id number]   CC: hospital follow up   HPI: Pt is 48 yo female recently hospitalized for acute pancreatitis and presenting for followup. She reports doing well, no concerns today, no N/V, abdominal pain, no chest pain or shortness of breath.   No Known Allergies Past Medical History  Diagnosis Date  . Opioid use disorder, severe, in sustained remission, on maintenance therapy 2013    on Methadone maintenance  . Vaginal delivery ~ 1993    dtr 48 yo as of 04/2014  . Chronic pain decades    mostly spinal  . Obesity 2015    230#, 104 kg, BMI 33 as of 04/2014  . DM (diabetes mellitus) 04/27/2014   Current Outpatient Prescriptions on File Prior to Visit  Medication Sig Dispense Refill  . diphenhydramine-acetaminophen (TYLENOL PM) 25-500 MG TABS Take 1 tablet by mouth daily as needed (for pain/sleep).      . METHADONE HCL PO Take 160 mg by mouth daily. Patient says she takes "160" from the methadone clinic, but was unable to answer any further questions or name the clinic.       No current facility-administered medications on file prior to visit.   No known family medical history   History   Social History  . Marital Status: Divorced    Spouse Name: N/A    Number of Children: N/A  . Years of Education: N/A   Occupational History  . Not on file.   Social History Main Topics  . Smoking status: Current Every Day Smoker  . Smokeless tobacco: Not on file  . Alcohol Use: No  . Drug Use: No  . Sexual Activity: Not on file   Other Topics Concern  . Not on file   Social History Narrative  . No narrative on file    Review of Systems  Constitutional: Negative for fever, chills, diaphoresis, activity change, appetite change and fatigue.  HENT: Negative for ear pain, nosebleeds, congestion, facial swelling, rhinorrhea, neck pain, neck stiffness and ear discharge.   Eyes: Negative for pain, discharge, redness, itching and  visual disturbance.  Respiratory: Negative for cough, choking, chest tightness, shortness of breath, wheezing and stridor.   Cardiovascular: Negative for chest pain, palpitations and leg swelling.  Gastrointestinal: Negative for abdominal distention.  Genitourinary: Negative for dysuria, urgency, frequency, hematuria, flank pain, decreased urine volume, difficulty urinating and dyspareunia.  Musculoskeletal: Negative for back pain, joint swelling, arthralgias and gait problem.  Neurological: Negative for dizziness, tremors, seizures, syncope, facial asymmetry, speech difficulty, weakness, light-headedness, numbness and headaches.  Hematological: Negative for adenopathy. Does not bruise/bleed easily.  Psychiatric/Behavioral: Negative for hallucinations, behavioral problems, confusion, dysphoric mood, decreased concentration and agitation.    Objective:   Filed Vitals:   05/07/14 0949  BP: 149/82  Pulse: 74  Temp: 98.3 F (36.8 C)  Resp: 16    Physical Exam  Constitutional: Appears well-developed and well-nourished. No distress.  HENT: Normocephalic. External right and left ear normal. Oropharynx is clear and moist.  Eyes: Conjunctivae and EOM are normal. PERRLA, no scleral icterus.  Neck: Normal ROM. Neck supple. No JVD. No tracheal deviation. No thyromegaly.  CVS: RRR, S1/S2 +, no murmurs, no gallops, no carotid bruit.  Pulmonary: Effort and breath sounds normal, no stridor, rhonchi, wheezes, rales.  Abdominal: Soft. BS +,  no distension, tenderness, rebound or guarding.  Musculoskeletal: Normal range of motion. No edema and no tenderness.  Lymphadenopathy: No  lymphadenopathy noted, cervical, inguinal. Neuro: Alert. Normal reflexes, muscle tone coordination. No cranial nerve deficit. Skin: Skin is warm and dry. No rash noted. Not diaphoretic. No erythema. No pallor.  Psychiatric: Normal mood and affect. Behavior, judgment, thought content normal.   Lab Results  Component Value  Date   WBC 12.1* 04/26/2014   HGB 10.3* 04/26/2014   HCT 33.1* 04/26/2014   MCV 99.1 04/26/2014   PLT 289 04/26/2014   Lab Results  Component Value Date   CREATININE 0.71 04/26/2014   BUN 4* 04/26/2014   NA 139 04/26/2014   K 4.0 04/26/2014   CL 99 04/26/2014   CO2 26 04/26/2014    Lab Results  Component Value Date   HGBA1C 6.3* 04/25/2014   Lipid Panel     Component Value Date/Time   CHOL 287* 04/22/2014 2244   TRIG 432* 04/22/2014 2244   HDL 25* 04/22/2014 2244   CHOLHDL 11.5 04/22/2014 2244   VLDL UNABLE TO CALCULATE IF TRIGLYCERIDE OVER 400 mg/dL 1/61/0960 4540   LDLCALC UNABLE TO CALCULATE IF TRIGLYCERIDE OVER 400 mg/dL 9/81/1914 7829       Assessment and plan:   Patient Active Problem List   Diagnosis Date Noted  . Other and unspecified hyperlipidemia - pt reluctant to start medication, we have discussed importance of monitoring cholesterol level closely, she has agreed to try dietary life style changes which we discussed in detail, recheck lipid panel in 3 months  04/27/2014  . DM (diabetes mellitus) - dietary recommendations provided, recheck A1C in 3 months  04/27/2014  . Pancreatitis - check lipase today and CMET  04/23/2014

## 2014-05-07 NOTE — Progress Notes (Signed)
Patient here for hospital f/u for pancreatitis and fatty liver.  Patient states she was told she is pre diabetic

## 2014-10-23 ENCOUNTER — Ambulatory Visit
Admission: RE | Admit: 2014-10-23 | Discharge: 2014-10-23 | Disposition: A | Discharge status: Home / Self Care | Payer: No Typology Code available for payment source | Source: Ambulatory Visit | Attending: Infectious Disease | Admitting: Infectious Disease

## 2014-10-23 ENCOUNTER — Other Ambulatory Visit: Payer: Self-pay | Admitting: Infectious Disease

## 2014-10-23 DIAGNOSIS — R7611 Nonspecific reaction to tuberculin skin test without active tuberculosis: Secondary | ICD-10-CM

## 2015-11-28 ENCOUNTER — Emergency Department (HOSPITAL_COMMUNITY)
Admission: EM | Admit: 2015-11-28 | Discharge: 2015-11-28 | Disposition: A | Discharge status: Other Acute Inpatient Hospital | Payer: Self-pay | Attending: Emergency Medicine | Admitting: Emergency Medicine

## 2015-11-28 ENCOUNTER — Emergency Department (HOSPITAL_COMMUNITY): Payer: Self-pay

## 2015-11-28 ENCOUNTER — Inpatient Hospital Stay (HOSPITAL_COMMUNITY)
Admission: AD | Admit: 2015-11-28 | Discharge: 2015-12-15 | DRG: 885 | Disposition: A | Discharge status: Home / Self Care | Payer: Federal, State, Local not specified - Other | Source: Intra-hospital | Attending: Psychiatry | Admitting: Psychiatry

## 2015-11-28 ENCOUNTER — Encounter (HOSPITAL_COMMUNITY): Payer: Self-pay

## 2015-11-28 DIAGNOSIS — R45851 Suicidal ideations: Secondary | ICD-10-CM | POA: Insufficient documentation

## 2015-11-28 DIAGNOSIS — Z823 Family history of stroke: Secondary | ICD-10-CM

## 2015-11-28 DIAGNOSIS — Z8241 Family history of sudden cardiac death: Secondary | ICD-10-CM | POA: Diagnosis not present

## 2015-11-28 DIAGNOSIS — F17210 Nicotine dependence, cigarettes, uncomplicated: Secondary | ICD-10-CM | POA: Diagnosis present

## 2015-11-28 DIAGNOSIS — Z8719 Personal history of other diseases of the digestive system: Secondary | ICD-10-CM | POA: Insufficient documentation

## 2015-11-28 DIAGNOSIS — E039 Hypothyroidism, unspecified: Secondary | ICD-10-CM | POA: Diagnosis present

## 2015-11-28 DIAGNOSIS — F329 Major depressive disorder, single episode, unspecified: Secondary | ICD-10-CM | POA: Insufficient documentation

## 2015-11-28 DIAGNOSIS — K760 Fatty (change of) liver, not elsewhere classified: Secondary | ICD-10-CM | POA: Diagnosis present

## 2015-11-28 DIAGNOSIS — F322 Major depressive disorder, single episode, severe without psychotic features: Principal | ICD-10-CM | POA: Diagnosis present

## 2015-11-28 DIAGNOSIS — E669 Obesity, unspecified: Secondary | ICD-10-CM | POA: Insufficient documentation

## 2015-11-28 DIAGNOSIS — G8929 Other chronic pain: Secondary | ICD-10-CM | POA: Insufficient documentation

## 2015-11-28 DIAGNOSIS — E119 Type 2 diabetes mellitus without complications: Secondary | ICD-10-CM | POA: Diagnosis present

## 2015-11-28 DIAGNOSIS — R079 Chest pain, unspecified: Secondary | ICD-10-CM | POA: Insufficient documentation

## 2015-11-28 DIAGNOSIS — F32A Depression, unspecified: Secondary | ICD-10-CM

## 2015-11-28 DIAGNOSIS — M545 Low back pain: Secondary | ICD-10-CM | POA: Diagnosis present

## 2015-11-28 DIAGNOSIS — E785 Hyperlipidemia, unspecified: Secondary | ICD-10-CM | POA: Diagnosis present

## 2015-11-28 DIAGNOSIS — F419 Anxiety disorder, unspecified: Secondary | ICD-10-CM | POA: Diagnosis present

## 2015-11-28 DIAGNOSIS — G4700 Insomnia, unspecified: Secondary | ICD-10-CM | POA: Diagnosis present

## 2015-11-28 DIAGNOSIS — F17200 Nicotine dependence, unspecified, uncomplicated: Secondary | ICD-10-CM | POA: Insufficient documentation

## 2015-11-28 LAB — URINE MICROSCOPIC-ADD ON: RBC / HPF: NONE SEEN RBC/hpf (ref 0–5)

## 2015-11-28 LAB — CBC
HEMATOCRIT: 41.8 % (ref 36.0–46.0)
Hemoglobin: 14.6 g/dL (ref 12.0–15.0)
MCH: 32.2 pg (ref 26.0–34.0)
MCHC: 34.9 g/dL (ref 30.0–36.0)
MCV: 92.3 fL (ref 78.0–100.0)
Platelets: 264 10*3/uL (ref 150–400)
RBC: 4.53 MIL/uL (ref 3.87–5.11)
RDW: 13 % (ref 11.5–15.5)
WBC: 13 10*3/uL — AB (ref 4.0–10.5)

## 2015-11-28 LAB — I-STAT TROPONIN, ED: Troponin i, poc: 0 ng/mL (ref 0.00–0.08)

## 2015-11-28 LAB — HEPATIC FUNCTION PANEL
ALBUMIN: 4.2 g/dL (ref 3.5–5.0)
ALT: 18 U/L (ref 14–54)
AST: 16 U/L (ref 15–41)
Alkaline Phosphatase: 83 U/L (ref 38–126)
BILIRUBIN DIRECT: 0.1 mg/dL (ref 0.1–0.5)
Indirect Bilirubin: 0.9 mg/dL (ref 0.3–0.9)
TOTAL PROTEIN: 6.6 g/dL (ref 6.5–8.1)
Total Bilirubin: 1 mg/dL (ref 0.3–1.2)

## 2015-11-28 LAB — URINALYSIS, ROUTINE W REFLEX MICROSCOPIC
GLUCOSE, UA: NEGATIVE mg/dL
HGB URINE DIPSTICK: NEGATIVE
KETONES UR: 40 mg/dL — AB
Leukocytes, UA: NEGATIVE
Nitrite: NEGATIVE
PROTEIN: 30 mg/dL — AB
Specific Gravity, Urine: 1.03 (ref 1.005–1.030)
pH: 6 (ref 5.0–8.0)

## 2015-11-28 LAB — RAPID URINE DRUG SCREEN, HOSP PERFORMED
AMPHETAMINES: NOT DETECTED
Barbiturates: NOT DETECTED
Benzodiazepines: NOT DETECTED
Cocaine: NOT DETECTED
OPIATES: NOT DETECTED
Tetrahydrocannabinol: NOT DETECTED

## 2015-11-28 LAB — ACETAMINOPHEN LEVEL

## 2015-11-28 LAB — TROPONIN I

## 2015-11-28 LAB — BASIC METABOLIC PANEL
Anion gap: 16 — ABNORMAL HIGH (ref 5–15)
BUN: 10 mg/dL (ref 6–20)
CALCIUM: 9.8 mg/dL (ref 8.9–10.3)
CO2: 25 mmol/L (ref 22–32)
CREATININE: 0.94 mg/dL (ref 0.44–1.00)
Chloride: 106 mmol/L (ref 101–111)
GFR calc Af Amer: >60 mL/min (ref >60)
GFR calc non Af Amer: >60 mL/min (ref >60)
Glucose, Bld: 95 mg/dL (ref 65–99)
Potassium: 3.5 mmol/L (ref 3.5–5.1)
Sodium: 147 mmol/L — ABNORMAL HIGH (ref 135–145)

## 2015-11-28 LAB — ETHANOL

## 2015-11-28 LAB — SALICYLATE LEVEL: Salicylate Lvl: <4 mg/dL (ref 2.8–30.0)

## 2015-11-28 LAB — LIPASE, BLOOD: LIPASE: 22 U/L (ref 11–51)

## 2015-11-28 MED ORDER — ACETAMINOPHEN 325 MG PO TABS: 650.0000 mg | ORAL_TABLET | Freq: Four times a day (QID) | ORAL | Status: DC | PRN

## 2015-11-28 MED ORDER — LORAZEPAM 1 MG PO TABS: 1.0000 mg | ORAL_TABLET | Freq: Four times a day (QID) | ORAL | Status: DC | PRN

## 2015-11-28 MED ORDER — LORAZEPAM 2 MG/ML IJ SOLN
1.0000 mg | Freq: Once | INTRAMUSCULAR | Status: AC
  Administered 2015-11-28: 1 mg via INTRAVENOUS
  Filled 2015-11-28: qty 1

## 2015-11-28 MED ORDER — SODIUM CHLORIDE 0.9 % IV SOLN
1000.0000 mL | INTRAVENOUS | Status: DC
  Administered 2015-11-28: 1000 mL via INTRAVENOUS

## 2015-11-28 MED ORDER — SODIUM CHLORIDE 0.9 % IV SOLN
1000.0000 mL | Freq: Once | INTRAVENOUS | Status: AC
  Administered 2015-11-28: 1000 mL via INTRAVENOUS

## 2015-11-28 NOTE — ED Notes (Signed)
[facility] called and states they will be here in about 20 minutes. No IVC paper work at this time per Dr. Donnald Garre.

## 2015-11-28 NOTE — ED Notes (Signed)
Pt revealed suicidal thoughts to Dr. Broadus John, who plans to IVC patient ans transfer pt to [facility].

## 2015-11-28 NOTE — ED Notes (Signed)
PER EMS: pt was picked up by EMS from [facility]. She was hyperventilating and reporting CP and SOB and her RR was about 50 breaths per minute per EMS. EMS encouraged her to slow down her breathing and once she did that she reported all her ailments had subsided. She requested EMS to take her to the ED for depression. Pt denies current CP/SOB.

## 2015-11-28 NOTE — ED Notes (Signed)
Pt denies current thought of SI/HI.

## 2015-11-28 NOTE — ED Provider Notes (Signed)
CSN: [id number]     Arrival date & time 11/28/15  1535 History   First MD Initiated Contact with Patient 11/28/15 1833     Chief Complaint  Patient presents with  . Depression  . Chest Pain     (Consider location/radiation/quality/duration/timing/severity/associated sxs/prior Treatment) HPI Patient reports that she has been very depressed and had a difficult time since her parents died in the spring. She reports she has been trying to manage there is a send having other problems arise with her family members. She reports that she is crying all the time and has so many things to do that she can't seem to address. She reports sometimes she will just lie in bed crying. She reports she does not want to be here anymore but she is afraid of going to [employer]. She reports she has tried to put her hand through a window to cut her wrist with glass unsuccessfully. She reports she sometimes thinks about walking into traffic or trying to pull the entertainment center down on herself to crush her self. Chief complaint noted chest pain. The patient had been [facility] and was hyperventilating. Patient reports that she gets chest pain with some frequency over a number of years. She reports she's been told in the past that his anxiety or gastritis. He reports he gets sharp and burning in the center of her chest and radiates into her back. She reports she's also had pancreatitis and feels something like that. He is not had fever, cough or shortness of breath. Has been a chronic and intermittent problem. Past Medical History  Diagnosis Date  . Opioid use disorder, severe, in sustained remission, on maintenance therapy 2013    on Methadone maintenance  . Vaginal delivery ~ 1993    dtr 50 yo as of 04/2014  . Chronic pain decades    mostly spinal  . Obesity 2015    230#, 104 kg, BMI 33 as of 04/2014  . DM (diabetes mellitus) (HCC) 04/27/2014   Past Surgical History  Procedure Laterality Date  . Knee arthroscopy Right  ~ 1995    twice.   No family history on file. Social History  Substance Use Topics  . Smoking status: Current Every Day Smoker  . Smokeless tobacco: None  . Alcohol Use: No   OB History    No data available     Review of Systems  10 Systems reviewed and are negative for acute change except as noted in the HPI.   Allergies  Review of patient's allergies indicates no known allergies.  Home Medications   Prior to Admission medications   Medication Sig Start Date End Date Taking? Authorizing Provider  citalopram (CELEXA) 20 MG tablet Take 20 mg by mouth daily. Reported on 11/28/2015 11/08/15 12/08/15  Historical Provider, MD  DULoxetine (CYMBALTA) 60 MG capsule Take 60 mg by mouth 2 (two) times daily. Reported on 11/28/2015 11/08/15 12/08/15  Historical Provider, MD  METHADONE HCL PO Take 185 mg by mouth daily. Reported on 11/28/2015    Historical Provider, MD   BP 114/73 mmHg  Pulse 79  Temp(Src) 98.9 F (37.2 C) (Oral)  Resp 19  SpO2 96% Physical Exam  Constitutional: She is oriented to person, place, and time. She appears well-developed and well-nourished.  Patient has central obesity. She is alert and nontoxic. She does not have respiratory distress. She is crying profusely.  HENT:  Head: Normocephalic and atraumatic.  Mouth/Throat: Oropharynx is clear and moist.  Eyes: EOM are normal.  Pupils are equal, round, and reactive to light.  Neck: Neck supple.  Cardiovascular: Normal rate, regular rhythm, normal heart sounds and intact distal pulses.   Pulmonary/Chest: Effort normal and breath sounds normal.  Abdominal: Soft. Bowel sounds are normal. She exhibits no distension. There is no tenderness.  Musculoskeletal: Normal range of motion. She exhibits no edema.  Neurological: She is alert and oriented to person, place, and time. She has normal strength. Coordination normal. GCS eye subscore is 4. GCS verbal subscore is 5. GCS motor subscore is 6.  Skin: Skin is warm, dry and  intact.  Psychiatric: She has a normal mood and affect.    ED Course  Procedures (including critical care time) Labs Review Labs Reviewed  BASIC METABOLIC PANEL - Abnormal; Notable for the following:    Sodium 147 (*)    Anion gap 16 (*)    All other components within normal limits  CBC - Abnormal; Notable for the following:    WBC 13.0 (*)    All other components within normal limits  URINALYSIS, ROUTINE W REFLEX MICROSCOPIC (NOT AT [facility]) - Abnormal; Notable for the following:    Color, Urine AMBER (*)    Bilirubin Urine SMALL (*)    Ketones, ur 40 (*)    Protein, ur 30 (*)    All other components within normal limits  ACETAMINOPHEN LEVEL - Abnormal; Notable for the following:    Acetaminophen (Tylenol), Serum <10 (*)    All other components within normal limits  URINE MICROSCOPIC-ADD ON - Abnormal; Notable for the following:    Squamous Epithelial / LPF 0-5 (*)    Bacteria, UA RARE (*)    Casts HYALINE CASTS (*)    All other components within normal limits  URINE RAPID DRUG SCREEN, HOSP PERFORMED  ETHANOL  LIPASE, BLOOD  HEPATIC FUNCTION PANEL  SALICYLATE LEVEL  TROPONIN I  I-STAT TROPOININ, ED    Imaging Review Dg Chest 2 View  11/28/2015  CLINICAL DATA:  Pt reports left upper chest pain and shortness of breath that began today; Smoker x 34 yrs EXAM: CHEST  2 VIEW COMPARISON:  05/01/2015 FINDINGS: Heart size is normal. There is mild perihilar peribronchial thickening. There are no focal consolidations or pleural effusions. No pulmonary edema. IMPRESSION: 1. Bronchitic changes. 2.  No focal acute pulmonary abnormality. Electronically Signed   By: Norva Pavlov M.D.   On: 11/28/2015 16:43   I have personally reviewed and evaluated these images and lab results as part of my medical decision-making.   EKG Interpretation   Date/Time:  Saturday November 28 2015 15:50:20 EST Ventricular Rate:  99 PR Interval:  138 QRS Duration: 88 QT Interval:  364 QTC Calculation:  467 R Axis:   56 Text Interpretation:  Normal sinus rhythm Normal ECG Confirmed by  Donnald Garre, MD, Lebron Conners [phone]) on 11/28/2015 11:07:23 PM      MDM   Final diagnoses:  Depression  Suicidal ideation   [facility] and complaints of chest pain. Upon the time of my assessment however she was extremely tearful and reporting severe depression due to death of her parents in the spring. Patient describes chest pain as being a intermittent chronic problem that has had diagnostic evaluation in the past. She reported her main concern was that she has been having thoughts of trying to injure herself and kill herself but being unwilling to do so due to religious concerns. At this time she appears severely depressed and wishes  to get help with depression and situational stressors. Diagnostic workup does not indicate any acute cardiac etiology for her chest pain. This appears more chronic in nature exacerbated by anxiety. At this time she is medically cleared for treatment of depression and suicidal thoughts.    Arby Barrette, MD 11/28/15 [phone]

## 2015-11-28 NOTE — ED Notes (Signed)
Security at bedside to wand patient. 

## 2015-11-28 NOTE — ED Notes (Signed)
Pt informed a urine sample is needed. She states she will notify staff when she has to urinate.

## 2015-11-28 NOTE — ED Notes (Signed)
Staffing called for a sitter and they stated they will find someone to sit. Until then, [facility] staff will sit with patient.

## 2015-11-28 NOTE — ED Notes (Signed)
TSS has finished. Sitter from staffing at bedside.

## 2015-11-28 NOTE — ED Notes (Signed)
Ford at [facility] called.  Pt accepted at [facility], by Dr. Jama Flavors, room 403 bed 2, call report to [phone].
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
## 2015-11-29 ENCOUNTER — Encounter (HOSPITAL_COMMUNITY): Payer: Self-pay | Admitting: Emergency Medicine

## 2015-11-29 DIAGNOSIS — F329 Major depressive disorder, single episode, unspecified: Secondary | ICD-10-CM | POA: Diagnosis present

## 2015-11-29 DIAGNOSIS — F322 Major depressive disorder, single episode, severe without psychotic features: Principal | ICD-10-CM

## 2015-11-29 DIAGNOSIS — R45851 Suicidal ideations: Secondary | ICD-10-CM

## 2015-11-29 MED ORDER — CITALOPRAM HYDROBROMIDE 10 MG PO TABS
10.0000 mg | ORAL_TABLET | Freq: Every day | ORAL | Status: DC
Start: 2015-11-29 — End: 2015-11-29
  Filled 2015-11-29 (×2): qty 1

## 2015-11-29 MED ORDER — ALUM & MAG HYDROXIDE-SIMETH 200-200-20 MG/5ML PO SUSP: 30.0000 mL | ORAL | Status: DC | PRN

## 2015-11-29 MED ORDER — CIPROFLOXACIN HCL 500 MG PO TABS
500.0000 mg | ORAL_TABLET | Freq: Two times a day (BID) | ORAL | Status: DC
  Administered 2015-11-29 – 2015-12-02 (×6): 500 mg via ORAL
  Filled 2015-11-29 (×12): qty 1

## 2015-11-29 MED ORDER — ACETAMINOPHEN 325 MG PO TABS
650.0000 mg | ORAL_TABLET | Freq: Four times a day (QID) | ORAL | Status: DC | PRN
  Administered 2015-12-02 – 2015-12-15 (×9): 650 mg via ORAL
  Filled 2015-11-29 (×9): qty 2

## 2015-11-29 MED ORDER — TRAZODONE HCL 50 MG PO TABS
50.0000 mg | ORAL_TABLET | Freq: Every evening | ORAL | Status: DC | PRN
Start: 2015-11-29 — End: 2015-12-03
  Administered 2015-11-29 – 2015-12-02 (×6): 50 mg via ORAL
  Filled 2015-11-29 (×12): qty 1

## 2015-11-29 MED ORDER — DULOXETINE HCL 20 MG PO CPEP
20.0000 mg | ORAL_CAPSULE | Freq: Every day | ORAL | Status: DC
  Administered 2015-11-29 – 2015-11-30 (×2): 20 mg via ORAL
  Filled 2015-11-29 (×4): qty 1

## 2015-11-29 MED ORDER — NICOTINE 21 MG/24HR TD PT24
21.0000 mg | MEDICATED_PATCH | Freq: Every day | TRANSDERMAL | Status: DC
  Administered 2015-11-29 – 2015-12-15 (×17): 21 mg via TRANSDERMAL
  Filled 2015-11-29 (×20): qty 1

## 2015-11-29 MED ORDER — HYDROXYZINE HCL 25 MG PO TABS
25.0000 mg | ORAL_TABLET | Freq: Three times a day (TID) | ORAL | Status: DC | PRN
  Administered 2015-11-29 – 2015-12-07 (×14): 25 mg via ORAL
  Filled 2015-11-29 (×14): qty 1

## 2015-11-29 MED ORDER — MAGNESIUM HYDROXIDE 400 MG/5ML PO SUSP: 30.0000 mL | Freq: Every day | ORAL | Status: DC | PRN

## 2015-11-29 NOTE — [facility] Suicide Risk Assessment (Signed)
[facility] INPATIENT:  Family/Significant Other Suicide Prevention Education  Suicide Prevention Education:  Patient Refusal for Family/Significant Other Suicide Prevention Education: The patient Valerie Woodard has refused to provide written consent for family/significant other to be provided Family/Significant Other Suicide Prevention Education during admission and/or prior to discharge.  Physician notified.  Pt states she has nobody in her life so cannot provide a name to do the education with.  She was given a brochure and it was reviewed with her.  She stated she has no access to guns.  Sarina Ser 11/29/2015, 9:26 AM

## 2015-11-29 NOTE — Progress Notes (Signed)
Patient ID: Valerie Woodard, female   DOB: 1966-02-01, 50 y.o.   MRN: [id number] D: Patient in dayroom on approach. Pt reports feeling anxious and reports medication given earlier has not been effective. Pt denies SI/HI/AVH and pain. Cooperative with assessment. No acute distressed noted at this time.  A: Met with pt 1:1. Medications administered as prescribed. Support and encouragement provided to attend groups and engage in milieu. Pt encouraged to discuss feelings and come to staff with any question or concerns.  R: Patient remains safe and complaint with medications.

## 2015-11-29 NOTE — Progress Notes (Addendum)
Patient ID: Valerie Woodard, female   DOB: 17-Feb-1966, 50 y.o.   MRN: [id number]   Pt currently presents with a masked affect and anxious behavior. Per self inventory, pt rates depression, hopelessness and anxiety at a 10. Pt's daily goal is "starting pain meds" and they intend to do so by "just seeing doctor." Pt reports poor sleep, a fair appetite, low energy and poor concentration. Pt affect bright, smiling when talking to MHT, flat, tearful, interaction almost childlike with nurse.   Pt provided with medications per providers orders. Pt's labs and vitals were monitored throughout the day. Pt supported emotionally and encouraged to express concerns and questions. Pt educated on medications.  Pt's safety ensured with 15 minute and environmental checks. Pt endorses passive SI, states "I would if I were at home" denies currently.  Pt currently denies HI and A/V hallucinations. Pt verbally agrees to seek staff if HI or A/VH occurs and to consult with staff before acting on any harmful thoughts. Will continue POC.
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
## 2015-11-29 NOTE — [facility] Group Notes (Signed)
[facility] Group Notes:  Healthy coping skills  Date:  11/29/2015  Time:  1300  Type of Therapy:  Nurse Education  Participation Level:  Did Not Attend  Participation Quality:  Inattentive  Affect:  Blunted  Cognitive:  Lacking  Insight:  None  Engagement in Group:  None  Modes of Intervention:  Discussion  Summary of Progress/Problems:Pt was asleep during group  Rodman Key [facility] 11/29/2015, 4:35 PM

## 2015-11-29 NOTE — Tx Team (Signed)
Initial Interdisciplinary Treatment Plan   PATIENT STRESSORS: Financial difficulties Loss of mother and dad   PATIENT STRENGTHS: Ability for insight Capable of independent living Communication skills General fund of knowledge   PROBLEM LIST: Problem List/Patient Goals Date to be addressed Date deferred Reason deferred Estimated date of resolution  "Depression" 11/28/15     "Anxiety" 11/28/15     "Suicide thoughts not having them" 11/28/15                                          DISCHARGE CRITERIA:  Improved stabilization in mood, thinking, and/or behavior Motivation to continue treatment in a less acute level of care Safe-care adequate arrangements made Verbal commitment to aftercare and medication compliance  PRELIMINARY DISCHARGE PLAN: Attend aftercare/continuing care group Return to previous living arrangement  PATIENT/FAMIILY INVOLVEMENT: This treatment plan has been presented to and reviewed with the patient, Valerie Woodard.  The patient and family have been given the opportunity to ask questions and make suggestions.  Curly Rim 11/29/2015, 1:13 AM
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
## 2015-11-29 NOTE — [facility] Group Notes (Signed)
[facility] LCSW Group Therapy Note   11/29/2015  10 AM   Type of Therapy and Topic: Group Therapy: Feelings Around Returning Home & Establishing a Supportive Framework and Activity to Identify signs of Improvement or Decompensation   Participation Level: Did not attend   Carney Bern, LCSW
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
## 2015-11-30 MED ORDER — GABAPENTIN 100 MG PO CAPS
100.0000 mg | ORAL_CAPSULE | Freq: Three times a day (TID) | ORAL | Status: DC
  Administered 2015-11-30: 100 mg via ORAL
  Filled 2015-11-30 (×6): qty 1

## 2015-11-30 MED ORDER — DULOXETINE HCL 20 MG PO CPEP
40.0000 mg | ORAL_CAPSULE | Freq: Every day | ORAL | Status: DC
  Administered 2015-12-01: 40 mg via ORAL
  Filled 2015-11-30 (×3): qty 2

## 2015-11-30 MED ORDER — GABAPENTIN 100 MG PO CAPS
200.0000 mg | ORAL_CAPSULE | Freq: Two times a day (BID) | ORAL | Status: DC
  Administered 2015-12-01 – 2015-12-03 (×5): 200 mg via ORAL
  Filled 2015-11-30 (×9): qty 2

## 2015-11-30 NOTE — Tx Team (Signed)
Interdisciplinary Treatment Plan Update (Adult) Date: 11/30/2015   Date: 11/30/2015 8:44 AM  Progress in Treatment:  Attending groups: No  Participating in groups: No  Taking medication as prescribed: Yes  Tolerating medication: Yes  Family/Significant othe contact made: No, Pt declines Patient understands diagnosis: Yes AEB seeking help with depression Discussing patient identified problems/goals with staff: Yes  Medical problems stabilized or resolved: Yes  Denies suicidal/homicidal ideation: Yes Patient has not harmed self or Others: Yes   New problem(s) identified: None identified at this time.   Discharge Plan or Barriers: Pt will return home and follow-up with outpatient resources  Additional comments:  Patient and CSW reviewed pt's identified goals and treatment plan. Patient verbalized understanding and agreed to treatment plan. CSW reviewed [facility] "Discharge Process and Patient Involvement" Form. Pt verbalized understanding of information provided and signed form.   Reason for Continuation of Hospitalization:  Anxiety Depression Medication stabilization Suicidal ideation  Estimated length of stay: 3-5 days  Review of initial/current patient goals per problem list:   1.  Goal(s): Patient will participate in aftercare plan  Met:  Yes  Target date: 3-5 days from date of admission   As evidenced by: Patient will participate within aftercare plan AEB aftercare provider and housing plan at discharge being identified.   11/30/15: Pt will return home and follow-up with outpatient resources  2.  Goal (s): Patient will exhibit decreased depressive symptoms and suicidal ideations.  Met:  No  Target date: 3-5 days from date of admission   As evidenced by: Patient will utilize self rating of depression at 3 or below and demonstrate decreased signs of depression or be deemed stable for discharge by MD. 11/30/15: Pt was admitted with symptoms of depression, rating 10/10. Pt continues  to present with flat affect and depressive symptoms.    3.  Goal(s): Patient will demonstrate decreased signs and symptoms of anxiety.  Met:  No  Target date: 3-5 days from date of admission   As evidenced by: Patient will utilize self rating of anxiety at 3 or below and demonstrated decreased signs of anxiety, or be deemed stable for discharge by MD 11/30/15: Pt was admitted with increased levels of anxiety and is currently rating those symptoms highly.   Attendees:  Patient:    Family:    Physician: Dr. Parke Poisson, MD  11/30/2015 8:44 AM  Nursing: Lars Pinks, RN Case manager  11/30/2015 8:44 AM  Clinical Social Worker Peri Maris, [place] 11/30/2015 8:44 AM  Other: Tilden Fossa, [place] 11/30/2015 8:44 AM  Clinical: Marcella Dubs, RN; Grayland Ormond, RN 11/30/2015 8:44 AM  Other: , RN Charge Nurse 11/30/2015 8:44 AM  Other: Hilda Lias, [place], [place] Social Work [phone]

## 2015-11-30 NOTE — Plan of Care (Signed)
Problem: Consults Goal: Depression Patient Education See Patient Education Module for education specifics.  Outcome: Progressing Nurse discussed depression/coping skills with patient.        

## 2015-11-30 NOTE — Progress Notes (Signed)
Adult Psychoeducational Group Note  Date:  11/30/2015 Time:  8:38 PM  Group Topic/Focus:  Wrap-Up Group:   The focus of this group is to help patients review their daily goal of treatment and discuss progress on daily workbooks.  Participation Level:  None  Participation Quality:  Attentive  Affect:  Anxious and Appropriate  Cognitive:  Appropriate  Insight: Appropriate  Engagement in Group:  Lacking  Modes of Intervention:  Discussion  Additional Comments:  Pt was present during wrap-up group, however chose not to participate. Pt rated her overall day a 4 out of 10 and when she was asked why she rated her day a 4, she anxiously, but pleasantly said that she did not want to talk in front of people.   [name] 11/30/2015, 9:01 PM

## 2015-11-30 NOTE — Plan of Care (Signed)
Problem: Diagnosis: Increased Risk For Suicide Attempt Goal: STG-Patient Will Comply With Medication Regime Outcome: Progressing Pt compliant with medication regime     

## 2015-11-30 NOTE — [facility] Group Notes (Signed)
[facility] LCSW Aftercare Discharge Planning Group Note  11/30/2015 8:45 AM  Pt did not attend, declined invitation.   Chad Cordial, LCSWA 11/30/2015 9:29 AM

## 2015-11-30 NOTE — Progress Notes (Signed)
[facility] MD Progress Note  11/30/2015 2:32 PM Valerie Woodard  MRN:  [id number] Subjective:  Reports feeling depressed, sad, anxious, but does state that she feels " a little bit better", and feels that milieu/ unit is helping her feel better . At this time denies medication side effects. Objective : I have discussed case with treatment team and I have met with patient . Patient has presented sad, depressed , tearful on unit. She is calm, pleasant on approach, and has been going to some groups. No disruptive or agitated behaviors on unit.  She does not endorse medication side effects at this time . She attributes her depression at least in part to death of loved ones  (parents ) , and ruminates about " feeling like I don't have anyone left ". Although denies any suicidal plan or intention on unit, and contracts for safety, states she continues to have passive thoughts of death and of wanting to see her deceased mother again. Responds partially to empathy, support, and affect did improve partially during session.   Principal Problem: Major depressive disorder ([place]) Diagnosis:   Patient Active Problem List   Diagnosis Date Noted  . Major depressive disorder ([place]) [F32.9] 11/29/2015  . 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 Metabolic syndrome [I77.82] 04/27/2014  . Other and unspecified hyperlipidemia [E78.5] 04/27/2014  . DM (diabetes mellitus) ([place]) [E11.9] 04/27/2014  . Pancreatitis [K85.9] 04/23/2014  . Acute pancreatitis [K85.90] 04/23/2014  . Chest pain [R07.9] 04/23/2014  . Chronic pain syndrome [G89.4] 04/23/2014  . Hepatic steatosis [K76.0] 04/23/2014   Total Time spent with patient: 25 minutes     Past Medical History:  Past Medical History  Diagnosis Date  . Opioid use disorder, severe, in sustained remission, on maintenance therapy 2013    on Methadone maintenance  . Vaginal delivery ~ 1993    dtr 50 yo as of 04/2014  . Chronic pain decades    mostly spinal  . Obesity  2015    230#, 104 kg, BMI 33 as of 04/2014  . DM (diabetes mellitus) ([place]) 04/27/2014    Past Surgical History  Procedure Laterality Date  . Knee arthroscopy Right ~ 1995    twice.   Family History: History reviewed. No pertinent family history.  Social History:  History  Alcohol Use No     History  Drug Use No    Social History   Social History  . Marital Status: Divorced    Spouse Name: N/A  . Number of Children: N/A  . Years of Education: N/A   Social History Main Topics  . Smoking status: Current Every Day Smoker -- 2.00 packs/day  . Smokeless tobacco: None  . Alcohol Use: No  . Drug Use: No  . Sexual Activity: Not Asked   Other Topics Concern  . None   Social History Narrative   Additional Social History:   Sleep: improved   Appetite:  Fair  Current Medications: Current Facility-Administered Medications  Medication Dose Route Frequency Provider Last Rate Last Dose  . acetaminophen (TYLENOL) tablet 650 mg  650 mg Oral Q6H PRN Lurena Nida, NP      . alum & mag hydroxide-simeth (MAALOX/MYLANTA) 200-200-20 MG/5ML suspension 30 mL  30 mL Oral Q4H PRN Lurena Nida, NP      . ciprofloxacin (CIPRO) tablet 500 mg  500 mg Oral BID Derrill Center, NP   500 mg at 11/30/15 0751  . [START ON 12/01/2015] DULoxetine (CYMBALTA) DR capsule 40 mg  40 mg Oral Daily Myer Peer Cobos, MD      . gabapentin (NEURONTIN) capsule 100 mg  100 mg Oral TID Jenne Campus, MD   100 mg at 11/30/15 1332  . hydrOXYzine (ATARAX/VISTARIL) tablet 25 mg  25 mg Oral TID PRN Derrill Center, NP   25 mg at 11/30/15 0758  . magnesium hydroxide (MILK OF MAGNESIA) suspension 30 mL  30 mL Oral Daily PRN Lurena Nida, NP      . nicotine (NICODERM CQ - dosed in mg/24 hours) patch 21 mg  21 mg Transdermal Daily Jenne Campus, MD   21 mg at 11/30/15 0748  . traZODone (DESYREL) tablet 50 mg  50 mg Oral QHS,MR X 1 Derrill Center, NP   50 mg at 11/29/15 2202    Lab Results:  Results for orders placed  or performed during the hospital encounter of 11/28/15 (from the past 48 hour(s))  Basic metabolic panel     Status: Abnormal   Collection Time: 11/28/15  4:03 PM  Result Value Ref Range   Sodium 147 (H) 135 - 145 mmol/L   Potassium 3.5 3.5 - 5.1 mmol/L   Chloride 106 101 - 111 mmol/L   CO2 25 22 - 32 mmol/L   Glucose, Bld 95 65 - 99 mg/dL   BUN 10 6 - 20 mg/dL   Creatinine, Ser 0.94 0.44 - 1.00 mg/dL   Calcium 9.8 8.9 - 10.3 mg/dL   GFR calc non Af Amer >60 >60 mL/min   GFR calc Af Amer >60 >60 mL/min    Comment: (NOTE) The eGFR has been calculated using the CKD EPI equation. This calculation has not been validated in all clinical situations. eGFR's persistently <60 mL/min signify possible Chronic Kidney Disease.    Anion gap 16 (H) 5 - 15  CBC     Status: Abnormal   Collection Time: 11/28/15  4:03 PM  Result Value Ref Range   WBC 13.0 (H) 4.0 - 10.5 K/uL   RBC 4.53 3.87 - 5.11 MIL/uL   Hemoglobin 14.6 12.0 - 15.0 g/dL   HCT 41.8 36.0 - 46.0 %   MCV 92.3 78.0 - 100.0 fL   MCH 32.2 26.0 - 34.0 pg   MCHC 34.9 30.0 - 36.0 g/dL   RDW 13.0 11.5 - 15.5 %   Platelets 264 150 - 400 K/uL  I-stat troponin, ED (not at [facility], [facility])     Status: None   Collection Time: 11/28/15  4:08 PM  Result Value Ref Range   Troponin i, poc 0.00 0.00 - 0.08 ng/mL   Comment 3            Comment: Due to the release kinetics of cTnI, a negative result within the first hours of the onset of symptoms does not rule out myocardial infarction with certainty. If myocardial infarction is still suspected, repeat the test at appropriate intervals.   Ethanol     Status: None   Collection Time: 11/28/15  8:02 PM  Result Value Ref Range   Alcohol, Ethyl (B) <5 <5 mg/dL    Comment:        LOWEST DETECTABLE LIMIT FOR SERUM ALCOHOL IS 5 mg/dL FOR MEDICAL PURPOSES ONLY   Lipase, blood     Status: None   Collection Time: 11/28/15  8:02 PM  Result Value Ref Range   Lipase 22 11 - 51 U/L  Hepatic function  panel     Status: None   Collection Time:  11/28/15  8:02 PM  Result Value Ref Range   Total Protein 6.6 6.5 - 8.1 g/dL   Albumin 4.2 3.5 - 5.0 g/dL   AST 16 15 - 41 U/L   ALT 18 14 - 54 U/L   Alkaline Phosphatase 83 38 - 126 U/L   Total Bilirubin 1.0 0.3 - 1.2 mg/dL   Bilirubin, Direct 0.1 0.1 - 0.5 mg/dL   Indirect Bilirubin 0.9 0.3 - 0.9 mg/dL  Salicylate level     Status: None   Collection Time: 11/28/15  8:02 PM  Result Value Ref Range   Salicylate Lvl <1.4 2.8 - 30.0 mg/dL  Acetaminophen level     Status: Abnormal   Collection Time: 11/28/15  8:02 PM  Result Value Ref Range   Acetaminophen (Tylenol), Serum <10 (L) 10 - 30 ug/mL    Comment:        THERAPEUTIC CONCENTRATIONS VARY SIGNIFICANTLY. A RANGE OF 10-30 ug/mL MAY BE AN EFFECTIVE CONCENTRATION FOR MANY PATIENTS. HOWEVER, SOME ARE BEST TREATED AT CONCENTRATIONS OUTSIDE THIS RANGE. ACETAMINOPHEN CONCENTRATIONS >150 ug/mL AT 4 HOURS AFTER INGESTION AND >50 ug/mL AT 12 HOURS AFTER INGESTION ARE OFTEN ASSOCIATED WITH TOXIC REACTIONS.   Troponin I     Status: None   Collection Time: 11/28/15  8:02 PM  Result Value Ref Range   Troponin I <0.03 <0.031 ng/mL    Comment:        NO INDICATION OF MYOCARDIAL INJURY.   Urinalysis, Routine w reflex microscopic     Status: Abnormal   Collection Time: 11/28/15  9:11 PM  Result Value Ref Range   Color, Urine AMBER (A) YELLOW    Comment: BIOCHEMICALS MAY BE AFFECTED BY COLOR   APPearance CLEAR CLEAR   Specific Gravity, Urine 1.030 1.005 - 1.030   pH 6.0 5.0 - 8.0   Glucose, UA NEGATIVE NEGATIVE mg/dL   Hgb urine dipstick NEGATIVE NEGATIVE   Bilirubin Urine SMALL (A) NEGATIVE   Ketones, ur 40 (A) NEGATIVE mg/dL   Protein, ur 30 (A) NEGATIVE mg/dL   Nitrite NEGATIVE NEGATIVE   Leukocytes, UA NEGATIVE NEGATIVE  Urine rapid drug screen (hosp performed)     Status: None   Collection Time: 11/28/15  9:11 PM  Result Value Ref Range   Opiates NONE DETECTED NONE DETECTED    Cocaine NONE DETECTED NONE DETECTED   Benzodiazepines NONE DETECTED NONE DETECTED   Amphetamines NONE DETECTED NONE DETECTED   Tetrahydrocannabinol NONE DETECTED NONE DETECTED   Barbiturates NONE DETECTED NONE DETECTED    Comment:        DRUG SCREEN FOR MEDICAL PURPOSES ONLY.  IF CONFIRMATION IS NEEDED FOR ANY PURPOSE, NOTIFY LAB WITHIN 5 DAYS.        LOWEST DETECTABLE LIMITS FOR URINE DRUG SCREEN Drug Class       Cutoff (ng/mL) Amphetamine      1000 Barbiturate      200 Benzodiazepine   782 Tricyclics       956 Opiates          300 Cocaine          300 THC              50   Urine microscopic-add on     Status: Abnormal   Collection Time: 11/28/15  9:11 PM  Result Value Ref Range   Squamous Epithelial / LPF 0-5 (A) NONE SEEN   WBC, UA 0-5 0 - 5 WBC/hpf   RBC / HPF NONE SEEN 0 - 5  RBC/hpf   Bacteria, UA RARE (A) NONE SEEN   Casts HYALINE CASTS (A) NEGATIVE    Physical Findings: AIMS: Facial and Oral Movements Muscles of Facial Expression: None, normal Lips and Perioral Area: None, normal Jaw: None, normal Tongue: None, normal,Extremity Movements Upper (arms, wrists, hands, fingers): None, normal Lower (legs, knees, ankles, toes): None, normal, Trunk Movements Neck, shoulders, hips: None, normal, Overall Severity Severity of abnormal movements (highest score from questions above): None, normal Incapacitation due to abnormal movements: None, normal Patient's awareness of abnormal movements (rate only patient's report): No Awareness, Dental Status Current problems with teeth and/or dentures?: No Does patient usually wear dentures?: No  CIWA:  CIWA-Ar Total: 1 COWS:  COWS Total Score: 2  Musculoskeletal: Strength & Muscle Tone: within normal limits Gait & Station: normal Patient leans: N/A  Psychiatric Specialty Exam: ROS denies chest pain, no shortness of breath , no vomiting   Blood pressure 127/80, pulse 81, temperature 97.2 F (36.2 C), temperature source Oral,  resp. rate 18, height _0  (1.702 m), weight 203 lb (92.08 kg), SpO2 96 %.Body mass index is 31.79 kg/(m^2).  General Appearance: Fairly Groomed  Engineer, water::  Good  Speech:  Normal Rate  Volume:  Normal  Mood:  depressed  Affect:  Constricted and Tearful- does smile at times appropriately   Thought Process:  Linear  Orientation:  Other:  fully alert and attentive   Thought Content:  denies hallucinations, no delusions , not internally preoccupied   Suicidal Thoughts:  Yes.  without intent/plan at this time denies plan or intention of hurting self or of suicidal ideations, contracts for safety on the unit   Homicidal Thoughts:  No  Memory:  recent and remote grossly intact   Judgement:  Fair  Insight:  Fair  Psychomotor Activity:  Normal  Concentration:  Good  Recall:  Good  Fund of Knowledge:Good  Language: Good  Akathisia:  Negative  Handed:  Right  AIMS (if indicated):     Assets:  Communication Skills Desire for Improvement Resilience  ADL's:  Intact  Cognition: WNL  Sleep:  Number of Hours: 6.75  Assessment- patient presents with depressed mood and constricted affect, but reports some improvement since admission to unit. Remains ruminative about losses and has had passive thoughts of death, but denies any current suicidal ideations and contracts for safety on unit.  Was started on  Cymbalta- no side effects reported . Treatment Plan Summary: Daily contact with patient to assess and evaluate symptoms and progress in treatment, Medication management, Plan inpatient admission and medications as below  Encourage ongoing  milieu and group participation to work on coping skills and symptom reduction  Increase Cymbalta to 40 mgrs QDAY for depression Increase Neurontin to 200 mgrs BID for management of anxiety and pain  Continue Trazodone 50 mgrs QHS PRN for insomnia as needed  Check TSH to monitor thyroid function  Neita Garnet, MD 11/30/2015, 2:32 PM

## 2015-11-30 NOTE — [facility] Group Notes (Signed)
[facility] LCSW Group Therapy  11/30/2015 1:15pm  Type of Therapy:  Group Therapy vercoming Obstacles  Participation Level:  Minimal  Participation Quality:  Attentive  Affect:  Flat  Cognitive:  Appropriate and Oriented  Insight:  Developing/Improving and Improving  Engagement in Therapy:  Limited  Modes of Intervention:  Discussion, Exploration, Problem-solving and Support  Description of Group:   In this group patients will be encouraged to explore what they see as obstacles to their own wellness and recovery. They will be guided to discuss their thoughts, feelings, and behaviors related to these obstacles. The group will process together ways to cope with barriers, with attention given to specific choices patients can make. Each patient will be challenged to identify changes they are motivated to make in order to overcome their obstacles. This group will be process-oriented, with patients participating in exploration of their own experiences as well as giving and receiving support and challenge from other group members.  Summary of Patient Progress: Pt was attentive to group discussion but did not participate verbally.   Therapeutic Modalities:   Cognitive Behavioral Therapy Solution Focused Therapy Motivational Interviewing Relapse Prevention Therapy   Chad Cordial, LCSWA 11/30/2015 3:10 PM

## 2015-11-30 NOTE — Progress Notes (Signed)
D:  Patient's self inventory sheet, patient has poor sleep, sleep medication is not helpful.  Fair appetite, low energy level, poor concentration.  Rated depression, hopeless and anxiety 10.  Withdrawals, chilling, diarrhea, cravings, agitation, nausea, runny nose, irritability, runny eyes.  Does have SI thoughts, contracts for safety.  Physical problems, headaches, blurred vision.  Physical pain, back, chest, worst pain #8 in past 24 hours.  Goal is to "get through today".  No discharge plans. A:  Medications administered per MD orders.  Emotional support and encouragement given patient.   R:  Patient stated she does have suicidal thoughts, has plans outside of [facility], but not while patient in [facility].  Contracts for safety.  Plans is to walk into a car, or pull down entertainment center on herself.  Denied HI.  "I don't have anybody any more."  Denied A/V hallucinations.  [place]   Safety maintained with 15 minute checks.

## 2015-11-30 NOTE — Progress Notes (Signed)
Recreation Therapy Notes  Date: 02.06.2017 Time: 9:30am Location: [address] Group Room   Group Topic: Stress Management  Goal Area(s) Addresses:  Patient will actively participate in stress management techniques presented during session.   Behavioral Response: Did not attend.   Marykay Lex Mariah Harn, LRT/CTRS        Eunice Oldaker L 11/30/2015 3:25 PM

## 2015-12-01 LAB — TSH: TSH: 10.967 u[IU]/mL — ABNORMAL HIGH (ref 0.350–4.500)

## 2015-12-01 MED ORDER — DULOXETINE HCL 60 MG PO CPEP
60.0000 mg | ORAL_CAPSULE | Freq: Every day | ORAL | Status: DC
  Administered 2015-12-02 – 2015-12-07 (×6): 60 mg via ORAL
  Filled 2015-12-01 (×9): qty 1

## 2015-12-01 NOTE — Progress Notes (Signed)
D: Patient presents with flat affect; depressed mood.  She rates her depressions as a 9; hopelessness as a 10; anxiety as a 10.  She continues to have passive suicidal thoughts, however, she contracts for safety on the unit.  Patient states, "I would not try to do anything here."  She reports poor sleep and appetite; low energy.  She reports withdrawal symptoms even though she has not had any methodone since December.  She denies HI/AVH. A: Continue to monitor medication management and MD orders.  Safety checks completed every 15 minutes per protocol.  Offer support and encouragement as needed. R: Patient is receptive to staff; her behavior is appropriate.

## 2015-12-01 NOTE — Progress Notes (Signed)
Patient ID: Valerie Woodard, female   DOB: Jul 23, 1966, 50 y.o.   MRN: [id number] [facility] MD Progress Note  12/01/2015 3:14 PM Joyanna Kleman  MRN:  [id number] Subjective:  Patient reports ongoing sadness , depression, anxiety. At this time does not endorse medication side effects.  Objective : I have discussed case with treatment team. She continues to present sad and intermittently tearful during session, although her affect is more reactive and also smiles at times appropriately. She continues to report feeling overwhelmed by stressors ( mainly relating to her parents' death, and estate - states the house they had been living in has black mold and needs repairs, which causes her to feel anxious )  Denies medication side effects. No disruptive or agitated behaviors on the unit. Pleasant upon approach, calm.  Some milieu participation, but little interaction with peers . TSH elevated - 10.9    Principal Problem: Major depressive disorder ([facility]) Diagnosis:   Patient Active Problem List   Diagnosis Date Noted  . Major depressive disorder ([facility]) [F32.9] 11/29/2015  . 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 Metabolic syndrome [E88.81] 04/27/2014  . Other and unspecified hyperlipidemia [E78.5] 04/27/2014  . DM (diabetes mellitus) ([facility]) [E11.9] 04/27/2014  . Pancreatitis [K85.9] 04/23/2014  . Acute pancreatitis [K85.90] 04/23/2014  . Chest pain [R07.9] 04/23/2014  . Chronic pain syndrome [G89.4] 04/23/2014  . Hepatic steatosis [K76.0] 04/23/2014   Total Time spent with patient: 20 minutes     Past Medical History:  Past Medical History  Diagnosis Date  . Opioid use disorder, severe, in sustained remission, on maintenance therapy 2013    on Methadone maintenance  . Vaginal delivery ~ 1993    dtr 50 yo as of 04/2014  . Chronic pain decades    mostly spinal  . Obesity 2015    230#, 104 kg, BMI 33 as of 04/2014  . DM (diabetes mellitus) ([facility]) 04/27/2014    Past Surgical History  Procedure  Laterality Date  . Knee arthroscopy Right ~ 1995    twice.   Family History: History reviewed. No pertinent family history.  Social History:  History  Alcohol Use No     History  Drug Use No    Social History   Social History  . Marital Status: Divorced    Spouse Name: N/A  . Number of Children: N/A  . Years of Education: N/A   Social History Main Topics  . Smoking status: Current Every Day Smoker -- 2.00 packs/day  . Smokeless tobacco: None  . Alcohol Use: No  . Drug Use: No  . Sexual Activity: Not Asked   Other Topics Concern  . None   Social History Narrative   Additional Social History:   Sleep: improved   Appetite:  Fair  Current Medications: Current Facility-Administered Medications  Medication Dose Route Frequency Provider Last Rate Last Dose  . acetaminophen (TYLENOL) tablet 650 mg  650 mg Oral Q6H PRN Kristeen Mans, NP      . alum & mag hydroxide-simeth (MAALOX/MYLANTA) 200-200-20 MG/5ML suspension 30 mL  30 mL Oral Q4H PRN Kristeen Mans, NP      . ciprofloxacin (CIPRO) tablet 500 mg  500 mg Oral BID Oneta Rack, NP   500 mg at 12/01/15 0758  . DULoxetine (CYMBALTA) DR capsule 40 mg  40 mg Oral Daily Craige Cotta, MD   40 mg at 12/01/15 0759  . gabapentin (NEURONTIN) capsule 200 mg  200 mg Oral BID Craige Cotta, MD  200 mg at 12/01/15 0758  . hydrOXYzine (ATARAX/VISTARIL) tablet 25 mg  25 mg Oral TID PRN Oneta Rack, NP   25 mg at 12/01/15 1438  . magnesium hydroxide (MILK OF MAGNESIA) suspension 30 mL  30 mL Oral Daily PRN Kristeen Mans, NP      . nicotine (NICODERM CQ - dosed in mg/24 hours) patch 21 mg  21 mg Transdermal Daily Craige Cotta, MD   21 mg at 12/01/15 0801  . traZODone (DESYREL) tablet 50 mg  50 mg Oral QHS,MR X 1 Oneta Rack, NP   50 mg at 11/30/15 2147    Lab Results:  Results for orders placed or performed during the hospital encounter of 11/28/15 (from the past 48 hour(s))  TSH     Status: Abnormal   Collection  Time: 12/01/15  6:50 AM  Result Value Ref Range   TSH 10.967 (H) 0.350 - 4.500 uIU/mL    Comment: Performed at [facility]    Physical Findings: AIMS: Facial and Oral Movements Muscles of Facial Expression: None, normal Lips and Perioral Area: None, normal Jaw: None, normal Tongue: None, normal,Extremity Movements Upper (arms, wrists, hands, fingers): None, normal Lower (legs, knees, ankles, toes): None, normal, Trunk Movements Neck, shoulders, hips: None, normal, Overall Severity Severity of abnormal movements (highest score from questions above): None, normal Incapacitation due to abnormal movements: None, normal Patient's awareness of abnormal movements (rate only patient's report): No Awareness, Dental Status Current problems with teeth and/or dentures?: No Does patient usually wear dentures?: No  CIWA:  CIWA-Ar Total: 1 COWS:  COWS Total Score: 2  Musculoskeletal: Strength & Muscle Tone: within normal limits Gait & Station: normal Patient leans: N/A  Psychiatric Specialty Exam: ROS denies chest pain, no shortness of breath , no vomiting   Blood pressure 149/74, pulse 82, temperature 97.7 F (36.5 C), temperature source Oral, resp. rate 12, height  (1.702 m), weight 203 lb (92.08 kg), SpO2 96 %.Body mass index is 31.79 kg/(m^2).  General Appearance: Fairly Groomed  Patent attorney::  Good  Speech:  Normal Rate  Volume:  Normal  Mood:  Remains sad, depressed   Affect:  Still constricted, but does smile at times, appropriately  Thought Process:  Linear  Orientation:  Other:  fully alert and attentive   Thought Content:  denies hallucinations, no delusions , not internally preoccupied   Suicidal Thoughts:   Denies suicidal or self injurious plans or intention at this time and contracts for safety on unit   Homicidal Thoughts:  No  Memory:  recent and remote grossly intact   Judgement:  Fair  Insight:  Fair  Psychomotor Activity:  Normal   Concentration:  Good  Recall:  Good  Fund of Knowledge:Good  Language: Good  Akathisia:  Negative  Handed:  Right  AIMS (if indicated):     Assets:  Communication Skills Desire for Improvement Resilience  ADL's:  Intact  Cognition: WNL  Sleep:  Number of Hours: 6.75  Assessment-  Patient reports ongoing depression, sadness, but denies suicidal ideations and contracts for safety on the unit. Although still tearful at times, affect appears somewhat more reactive. Thus far tolerating Cymbalta trial well. Of note, TSH elevated .  Treatment Plan Summary: Daily contact with patient to assess and evaluate symptoms and progress in treatment, Medication management, Plan inpatient admission and medications as below  Encourage ongoing  milieu and group participation to work on coping skills and symptom reduction  Increase Cymbalta  to 60  mgrs QDAY for depression Continue  Neurontin  200 mgrs BID for management of anxiety and pain  Continue Trazodone 50 mgrs QHS PRN for insomnia as needed  Check FT3, FT4 to follow up on elevated TSH finding/   thyroid function  Nehemiah Massed, MD 12/01/2015, 3:14 PM

## 2015-12-01 NOTE — [facility] Group Notes (Signed)
[facility] LCSW Group Therapy 12/01/2015 1:15 PM  Type of Therapy: Group Therapy- Feelings about Diagnosis  Participation Level: Minimal  Participation Quality:  Withdrawn  Affect:  Flat  Cognitive: Alert and Oriented   Insight:  Developing   Engagement in Therapy: Developing/Improving and Engaged   Modes of Intervention: Clarification, Confrontation, Discussion, Education, Exploration, Limit-setting, Orientation, Problem-solving, Rapport Building, Dance movement psychotherapist, Socialization and Support  Description of Group:   This group will allow patients to explore their thoughts and feelings about diagnoses they have received. Patients will be guided to explore their level of understanding and acceptance of these diagnoses. Facilitator will encourage patients to process their thoughts and feelings about the reactions of others to their diagnosis, and will guide patients in identifying ways to discuss their diagnosis with significant others in their lives. This group will be process-oriented, with patients participating in exploration of their own experiences as well as giving and receiving support and challenge from other group members.  Summary of Progress/Problems:  Pt attended group but did not participate in discussion.  Therapeutic Modalities:   Cognitive Behavioral Therapy Solution Focused Therapy Motivational Interviewing Relapse Prevention Therapy  Chad Cordial, LCSWA 12/01/2015 3:50 PM

## 2015-12-01 NOTE — Plan of Care (Signed)
Problem: Alteration in mood Goal: LTG-Patient reports reduction in suicidal thoughts (Patient reports reduction in suicidal thoughts and is able to verbalize a safety plan for whenever patient is feeling suicidal)  Outcome: Not Progressing Patient continues to have passive suicidal thoughts.  She contracts for safety on the unit.

## 2015-12-01 NOTE — Progress Notes (Signed)
Adult Psychoeducational Group Note  Date:  12/01/2015 Time:  8:30 PM  Group Topic/Focus:  Wrap-Up Group:   The focus of this group is to help patients review their daily goal of treatment and discuss progress on daily workbooks.  Participation Level:  Minimal  Participation Quality:  Attentive  Affect:  Appropriate  Cognitive:  Appropriate  Insight: Improving  Engagement in Group:  Engaged  Modes of Intervention:  Discussion  Additional Comments:  Pt was pleasant during wrap-up group. Pt spoke quietly and was short with her answers, but was still willing to participate. Pt rated her overall day a 5 out of 10 because "I was able to get through the day", which was also her goal for the day.   [name] 12/01/2015, 9:19 PM

## 2015-12-01 NOTE — Progress Notes (Signed)
Patient ID: Valerie Woodard, female   DOB: Jan 09, 1966, 50 y.o.   MRN: [id number] D: Patient mood and affect appeared depressed and anxious. Pt denies SI/HI/AVH and pain. Cooperative with assessment. No acute physical distress noted.  A: Met with pt 1:1. Medications administered as prescribed. Support and encouragement provided to attend groups and engage in milieu. Pt encouraged to discuss feelings and come to staff with any question or concerns.  R: Patient remains safe and complaint with medications.

## 2015-12-01 NOTE — [facility] Group Notes (Signed)
[facility] Group Notes:  (Nursing/MHT/Case Management/Adjunct)  Date:  12/01/2015  Time: 0900 am  Type of Therapy:  Psychoeducational Skills  Participation Level:  Minimal  Participation Quality:  Appropriate  Affect:  Flat  Cognitive:  Alert  Insight:  Lacking  Engagement in Group:  Resistant  Modes of Intervention:  Discussion  Summary of Progress/Problems: Valerie Woodard had minimal participation during group.  Valerie Woodard Mon 12/01/2015, 9:26 AM

## 2015-12-01 NOTE — Progress Notes (Signed)
Recreation Therapy Notes  Animal-Assisted Activity (AAA) Program Checklist/Progress Notes Patient Eligibility Criteria Checklist & Daily Group note for Rec Tx Intervention  Date: 02.07.2017 Time: 2:45pm Location: [address]    AAA/T Program Assumption of Risk Form signed by Patient/ or Parent Legal Guardian yes  Patient is free of allergies or sever asthma yes  Patient reports no fear of animals yes  Patient reports no history of cruelty to animals yes  Patient understands his/her participation is voluntary yes  Patient washes hands before animal contact yes  Patient washes hands after animal contact yes  Behavioral Response: Appropriate  Education: Hand Washing, Appropriate Animal Interaction   Education Outcome: Acknowledges education.   Clinical Observations/Feedback: Patient attended session, petting therapy dog and interacting with peers in group appropriately.   Marykay Lex Craige Patel, LRT/CTRS        Kyros Salzwedel L 12/01/2015 3:12 PM

## 2015-12-02 DIAGNOSIS — E039 Hypothyroidism, unspecified: Secondary | ICD-10-CM

## 2015-12-02 LAB — T4, FREE: Free T4: 0.59 ng/dL — ABNORMAL LOW (ref 0.61–1.12)

## 2015-12-02 MED ORDER — ARIPIPRAZOLE 2 MG PO TABS
2.0000 mg | ORAL_TABLET | Freq: Every day | ORAL | Status: DC
  Administered 2015-12-02 – 2015-12-03 (×2): 2 mg via ORAL
  Filled 2015-12-02 (×6): qty 1

## 2015-12-02 MED ORDER — LEVOTHYROXINE SODIUM 50 MCG PO TABS
50.0000 ug | ORAL_TABLET | Freq: Every day | ORAL | Status: DC
  Administered 2015-12-03 – 2015-12-15 (×13): 50 ug via ORAL
  Filled 2015-12-02 (×4): qty 1
  Filled 2015-12-02: qty 2
  Filled 2015-12-02 (×5): qty 1
  Filled 2015-12-02: qty 2
  Filled 2015-12-02 (×5): qty 1

## 2015-12-02 NOTE — Progress Notes (Signed)
D:  Patient's self inventory sheet, patient has poor sleep, sleep medication is not working.  Fair appetite, low energy level, poor concentration.  Rated depression 9, hopeless and anxiety 10.  Withdrawals, chilling,cravings, agitation, nausea, runny nose, irritability, eyes.  SI, contracts for safety, no plan.  Physical problems, lightheaded, pain, headaches, blurred vision.  Physical pain, worst pain in past 24 hours is #8, back, chest.  Pain medicine is not helpful.  Goal is to get through today.  Plans to attend groups.  No discharge plans. A:  Medications administered per MD orders.  Emotional support and encouragement given patient. R:  SI contracts for safety.  Denied HI.  Denied A/V hallucinations.  Safety maintained with 15 minute checks.

## 2015-12-02 NOTE — Consult Note (Signed)
[facility] Medical Consultation  Valerie Woodard ZHY:[id number] DOB: 10/24/1966 DOA: 11/28/2015   PCP: Patient does not have a PCP  Requesting physician: Dr. Jama Flavors Date of consultation: 12/02/15 Reason for consultation: Elevated TSH  Chief Complaint: Depression  HPI: Valerie Woodard is a 50 y.o. female  with a past medical history of opioid use disorder, presented with depression, anxiety. She was admitted to behavioral health for further management. The patient mentioned that she's had a very hard time since last year when she lost her parents. She also reported a history of abusing narcotic pain medications and was on methadone for 3 years and wean herself off of it in December 2016. She also reports losing weight last year. Patient denies any constipation. Reports some hair loss. No nausea, vomiting or diarrhea currently. Feels better with treatment in behavioral health.   Home Medications: Prior to Admission medications   Medication Sig Start Date End Date Taking? Authorizing Provider  citalopram (CELEXA) 20 MG tablet Take 20 mg by mouth daily. Reported on 11/28/2015 11/08/15 12/08/15  Historical Provider, MD  DULoxetine (CYMBALTA) 60 MG capsule Take 60 mg by mouth 2 (two) times daily. Reported on 11/28/2015 11/08/15 12/08/15  Historical Provider, MD  METHADONE HCL PO Take 185 mg by mouth daily. Reported on 11/28/2015    Historical Provider, MD    Current Inpatient Medications:  Scheduled: . ARIPiprazole  2 mg Oral Daily  . DULoxetine  60 mg Oral Daily  . gabapentin  200 mg Oral BID  . [START ON 12/03/2015] levothyroxine  50 mcg Oral Q breakfast  . nicotine  21 mg Transdermal Daily  . traZODone  50 mg Oral QHS,MR X 1   Continuous:  EXB:[id number], alum & mag hydroxide-simeth, hydrOXYzine, magnesium hydroxide  Allergies: No Known Allergies  Past Medical History: Past Medical History  Diagnosis Date  . Opioid use disorder, severe, in sustained remission, on maintenance therapy 2013   on Methadone maintenance  . Vaginal delivery ~ 1993    dtr 50 yo as of 04/2014  . Chronic pain decades    mostly spinal  . Obesity 2015    230#, 104 kg, BMI 33 as of 04/2014  . DM (diabetes mellitus) (HCC) 04/27/2014    Past Surgical History  Procedure Laterality Date  . Knee arthroscopy Right ~ 1995    twice.    Social History:  Patient lives in [place] by herself. Currently unemployed. Smokes one to 2 packs of cigarettes on a daily basis. Denies any alcohol use. History of opioid drug abuse in the past.   Family History:  history of stroke in the mother. Father died of sudden cardiac arrest.  Review of Systems - History obtained from the patient General ROS: positive for  - fatigue Psychological ROS: positive for - depression Ophthalmic ROS: negative ENT ROS: negative Allergy and Immunology ROS: negative Hematological and Lymphatic ROS: negative Endocrine ROS: negative Respiratory ROS: no cough, shortness of breath, or wheezing Cardiovascular ROS: no chest pain or dyspnea on exertion Gastrointestinal ROS: no abdominal pain, change in bowel habits, or black or bloody stools Genito-Urinary ROS: no dysuria, trouble voiding, or hematuria Musculoskeletal ROS: occasional leg swelling Neurological ROS: no TIA or stroke symptoms Dermatological ROS: negative  Physical Examination: Filed Vitals:   12/01/15 0617 12/02/15 0625 12/02/15 0626 12/02/15 1239  BP: 149/74 140/75 145/75 145/75  Pulse: 82 67 76 76  Temp:  97.6 F (36.4 C)    TempSrc:  Oral    Resp:  16  Height:      Weight:      SpO2:        General appearance: alert, cooperative, appears stated age and no distress Head: Normocephalic, without obvious abnormality, atraumatic Eyes: conjunctivae/corneas clear. PERRL, EOM's intact.  Nose: Nares normal. Septum midline. Mucosa normal. No drainage or sinus tenderness. Throat: lips, mucosa, and tongue normal; teeth and gums normal Neck: no adenopathy, no carotid  bruit, no JVD, supple, symmetrical, trachea midline and thyroid not enlarged, symmetric, no tenderness/mass/nodules Resp: clear to auscultation bilaterally Cardio: regular rate and rhythm, S1, S2 normal, no murmur, click, rub or gallop GI: soft, non-tender; bowel sounds normal; no masses,  no organomegaly Extremities: extremities normal, atraumatic, no cyanosis or edema Pulses: 2+ and symmetric Skin: Skin color, texture, turgor normal. No rashes or lesions Lymph nodes: Cervical, supraclavicular, and axillary nodes normal. Neurologic: She is alert and oriented 3. Cranial nerves II-12 intact. Motor strength equal bilateral upper and lower extremities. He does normal.  Laboratory Data: Results for orders placed or performed during the hospital encounter of 11/28/15 (from the past 48 hour(s))  TSH     Status: Abnormal   Collection Time: 12/01/15  6:50 AM  Result Value Ref Range   TSH 10.967 (H) 0.350 - 4.500 uIU/mL    Comment: Performed at [facility]    Imaging Studies: Chest x-ray from 3 days ago did not show any acute findings.  EKG: EKG from 4 days ago showed sinus rhythm without any acute ischemic changes.  Impression/Recommendations  Principal Problem:   Major depressive disorder [facility])   This is a 50 year old Caucasian female who is currently at behavioral health, being treated for depression and anxiety. She is found to have an elevated TSH. Patient denies any history of thyroid disease. No history of radiation treatment for any reason. There is no thyromegaly appreciated on examination.   #1 Hypothyroidism: Free T4 is pending. However, it is reasonable to start treatment with Synthroid considering that she is depressed. She will need follow-up TSH in 4-6 weeks with further titration of medication at that time if needed.  #2 Abnormal UA: Not suggestive of infection. Patient was on ciprofloxacin. Discussed with Dr. Jama Flavors and he has discontinued.  #3 History  of weight loss: She should pursue this further as an outpatient.  TR[facility] will follow-up on free T4 level.   Otherwise we will sign off.  Please contact me if I can be of further assistance. Thank you for this consultation.  [facility]  [facility] Pager [phone]  If 7PM-7AM, please contact night-coverage.  [email or web address] Password [facility]  12/02/2015, 5:51 PM

## 2015-12-02 NOTE — Progress Notes (Signed)
Adult Psychoeducational Group Note  Date:  12/02/2015 Time:  8:26 PM  Group Topic/Focus:  Wrap-Up Group:   The focus of this group is to help patients review their daily goal of treatment and discuss progress on daily workbooks.  Participation Level:  Active  Participation Quality:  Appropriate  Affect:  Appropriate  Cognitive:  Appropriate  Insight: Appropriate  Engagement in Group:  Engaged  Modes of Intervention:  Discussion  Additional Comments:  Pt was pleasant during wrap-up group. Pt rated her overall day a 5 out of 10 and stated that her day "was just a normal day". Pt reported that she did not have a goal for the day, however pt noted that going outside was the highlight of her day.   Valerie Woodard 12/02/2015, 8:54 PM

## 2015-12-02 NOTE — [facility] Group Notes (Signed)
[facility] LCSW Group Therapy  12/02/2015 1:15 PM  Type of Therapy: Group Therapy- Emotion Regulation  Pt arrived to group towards the end of the session and was observed to be tearful. She did not participate in group discussion.   Chad Cordial, LCSWA 12/02/2015 4:31 PM

## 2015-12-02 NOTE — Progress Notes (Signed)
  D: Pt is childlike, but pleasant. When asked about her day pt stated, "I just say a 9 out of 10". Encouraged pt to rate herself appropriately. Stated, she doesn't want to discuss it all day. Encouraged pt to inform the nurses that she'd rather wait and speak to her dr, but to not lie on the depression scale. Pt has no questions or concerns.   A:  Support and encouragement was offered. 15 min checks continued for safety.  R: Pt remains safe.

## 2015-12-02 NOTE — [facility] Group Notes (Signed)
[facility] LCSW Aftercare Discharge Planning Group Note  12/02/2015 8:45 AM  Pt did not attend, declined invitation.   Emeri Estill Carter, LCSWA 12/02/2015 9:20 AM  

## 2015-12-02 NOTE — Progress Notes (Addendum)
Patient ID: Valerie Woodard, female   DOB: 12-23-1965, 50 y.o.   MRN: [id number] [facility] MD Progress Note  12/02/2015 4:04 PM Valerie Woodard  MRN:  [id number] Subjective:   Patient reports ongoing depression, states she feels " a little better here because there are people who care ", but states that she feels she is going to feel more depressed after discharge again. States she continues to have  Some suicidal ideations , namely pulling her home's entertainment system over her after returning home. She contracts for safety on the unit, and denies any current suicidal plan or intent. She denies any medication side effects.   Objective : I have discussed case with treatment team. Patient visible on unit, going to some groups, limited interaction with peers, pleasant on approach. Presents sad, tearful, but smiles at times appropriately . As noted, reports ongoing suicidal ideations, with no current plan or intention on unit, and has not exhibited any self injurious behaviors on unit. Does ruminate about returning home, and worries about lack of social support, loneliness as stressors . Reports she is tolerating medications well . Patient had been on methadone up to a few weeks ago- at this time does not endorse or present with any residual withdrawal symptoms. She responds partially to support, encouragement, empathy- affect constricted but tends to improve during session.    Principal Problem: Major depressive disorder (HCC) Diagnosis:   Patient Active Problem List   Diagnosis Date Noted  . Major depressive disorder (HCC) [F32.9] 11/29/2015  . 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 Metabolic syndrome [E88.81] 04/27/2014  . Other and unspecified hyperlipidemia [E78.5] 04/27/2014  . DM (diabetes mellitus) (HCC) [E11.9] 04/27/2014  . Pancreatitis [K85.9] 04/23/2014  . Acute pancreatitis [K85.90] 04/23/2014  . Chest pain [R07.9] 04/23/2014  . Chronic pain syndrome [G89.4] 04/23/2014  . Hepatic  steatosis [K76.0] 04/23/2014   Total Time spent with patient: 25 minutes     Past Medical History:  Past Medical History  Diagnosis Date  . Opioid use disorder, severe, in sustained remission, on maintenance therapy 2013    on Methadone maintenance  . Vaginal delivery ~ 1993    dtr 50 yo as of 04/2014  . Chronic pain decades    mostly spinal  . Obesity 2015    230#, 104 kg, BMI 33 as of 04/2014  . DM (diabetes mellitus) (HCC) 04/27/2014    Past Surgical History  Procedure Laterality Date  . Knee arthroscopy Right ~ 1995    twice.   Family History: History reviewed. No pertinent family history.  Social History:  History  Alcohol Use No     History  Drug Use No    Social History   Social History  . Marital Status: Divorced    Spouse Name: N/A  . Number of Children: N/A  . Years of Education: N/A   Social History Main Topics  . Smoking status: Current Every Day Smoker -- 2.00 packs/day  . Smokeless tobacco: None  . Alcohol Use: No  . Drug Use: No  . Sexual Activity: Not Asked   Other Topics Concern  . None   Social History Narrative   Additional Social History:   Sleep: improved   Appetite:  Fair  Current Medications: Current Facility-Administered Medications  Medication Dose Route Frequency Provider Last Rate Last Dose  . acetaminophen (TYLENOL) tablet 650 mg  650 mg Oral Q6H PRN Kristeen Mans, NP   650 mg at 12/02/15 1335  . alum & mag hydroxide-simeth (MAALOX/MYLANTA)  200-200-20 MG/5ML suspension 30 mL  30 mL Oral Q4H PRN Kristeen Mans, NP      . ciprofloxacin (CIPRO) tablet 500 mg  500 mg Oral BID Oneta Rack, NP   500 mg at 12/02/15 0807  . DULoxetine (CYMBALTA) DR capsule 60 mg  60 mg Oral Daily Craige Cotta, MD   60 mg at 12/02/15 0807  . gabapentin (NEURONTIN) capsule 200 mg  200 mg Oral BID Craige Cotta, MD   200 mg at 12/02/15 0981  . hydrOXYzine (ATARAX/VISTARIL) tablet 25 mg  25 mg Oral TID PRN Oneta Rack, NP   25 mg at 12/02/15  1335  . magnesium hydroxide (MILK OF MAGNESIA) suspension 30 mL  30 mL Oral Daily PRN Kristeen Mans, NP      . nicotine (NICODERM CQ - dosed in mg/24 hours) patch 21 mg  21 mg Transdermal Daily Craige Cotta, MD   21 mg at 12/02/15 0808  . traZODone (DESYREL) tablet 50 mg  50 mg Oral QHS,MR X 1 Oneta Rack, NP   50 mg at 12/01/15 2246    Lab Results:  Results for orders placed or performed during the hospital encounter of 11/28/15 (from the past 48 hour(s))  TSH     Status: Abnormal   Collection Time: 12/01/15  6:50 AM  Result Value Ref Range   TSH 10.967 (H) 0.350 - 4.500 uIU/mL    Comment: Performed at [facility]    Physical Findings: AIMS: Facial and Oral Movements Muscles of Facial Expression: None, normal Lips and Perioral Area: None, normal Jaw: None, normal Tongue: None, normal,Extremity Movements Upper (arms, wrists, hands, fingers): None, normal Lower (legs, knees, ankles, toes): None, normal, Trunk Movements Neck, shoulders, hips: None, normal, Overall Severity Severity of abnormal movements (highest score from questions above): None, normal Incapacitation due to abnormal movements: None, normal Patient's awareness of abnormal movements (rate only patient's report): No Awareness, Dental Status Current problems with teeth and/or dentures?: No Does patient usually wear dentures?: No  CIWA:  CIWA-Ar Total: 1 COWS:  COWS Total Score: 1  Musculoskeletal: Strength & Muscle Tone: within normal limits Gait & Station: normal Patient leans: N/A  Psychiatric Specialty Exam: ROS denies chest pain, no shortness of breath , no vomiting , no dysuria or urgency  Blood pressure 145/75, pulse 76, temperature 97.6 F (36.4 C), temperature source Oral, resp. rate 16, height  (1.702 m), weight 203 lb (92.08 kg), SpO2 96 %.Body mass index is 31.79 kg/(m^2).  General Appearance: improved grooming   Eye Contact::  Good  Speech:  Normal Rate  Volume:   Normal  Mood:  Remains sad, depressed   Affect:  Still constricted, but more reactive than on admission  Thought Process:  Linear  Orientation:  Other:  fully alert and attentive   Thought Content:  denies hallucinations, no delusions , not internally preoccupied   Suicidal Thoughts: reports some suicidal thoughts of hurting self after discharge/returning to her home- at this time denies plan or intention of hurting self or SI on the unit, contracts for safety on unit   Homicidal Thoughts:  No  Memory:  recent and remote grossly intact   Judgement:  Fair  Insight:  Fair  Psychomotor Activity:  Normal  Concentration:  Good  Recall:  Good  Fund of Knowledge:Good  Language: Good  Akathisia:  Negative  Handed:  Right  AIMS (if indicated):     Assets:  Communication Skills Desire  for Improvement Resilience  ADL's:  Intact  Cognition: WNL  Sleep:  Number of Hours: 6.25  Assessment-  Remains depressed, constricted in affect, (+) SI but no current plan or intent. At this time tolerating medications well, does not endorse medication side effects. TSH elevated - have ordered follow up T3/FT4- results pending .  Treatment Plan Summary: Daily contact with patient to assess and evaluate symptoms and progress in treatment, Medication management, Plan inpatient admission and medications as below  Encourage ongoing  milieu and group participation to work on coping skills and symptom reduction  Continue Cymbalta  60  mgrs QDAY for depression Consider discontinuing Cipro - U/A reviewed, no symptoms of UTI, and may have drug drug interactions with other medications Add Abilify 2 mgrs QDAY as augmentation Continue Neurontin  200 mgrs BID for management of anxiety and pain  Continue Trazodone 50 mgrs QHS PRN for insomnia as needed  Have requested Hospitalist Consult to address hypothyroidism which may be contributing to depression- as discussed , start Synthroid 50 micrograms daily.  Check FT3, FT4    Nehemiah Massed, MD 12/02/2015, 4:04 PM

## 2015-12-02 NOTE — Progress Notes (Signed)
Recreation Therapy Notes  Date: 02.08.2017  Time: 9:30am Location: [address] Group Room   Group Topic: Stress Management  Goal Area(s) Addresses:  Patient will actively participate in stress management techniques presented during session.   Behavioral Response: Did not attend.   Valerie Woodard Valerie Woodard, LRT/CTRS        Valerie Woodard Valerie 12/02/2015 4:05 PM 

## 2015-12-02 NOTE — Plan of Care (Signed)
Problem: Consults Goal: Anxiety Disorder Patient Education See Patient Education Module for eduction specifics.  Outcome: Progressing Nurse discussed anxiety/coping skills with patient.        

## 2015-12-03 LAB — T3, FREE: T3, Free: 2.2 pg/mL (ref 2.0–4.4)

## 2015-12-03 MED ORDER — GABAPENTIN 300 MG PO CAPS
300.0000 mg | ORAL_CAPSULE | Freq: Two times a day (BID) | ORAL | Status: DC
  Administered 2015-12-03 – 2015-12-15 (×24): 300 mg via ORAL
  Filled 2015-12-03 (×28): qty 1

## 2015-12-03 MED ORDER — ARIPIPRAZOLE 5 MG PO TABS
5.0000 mg | ORAL_TABLET | Freq: Every day | ORAL | Status: DC
  Filled 2015-12-03: qty 1

## 2015-12-03 MED ORDER — ZOLPIDEM TARTRATE 5 MG PO TABS
5.0000 mg | ORAL_TABLET | Freq: Every day | ORAL | Status: DC
  Administered 2015-12-03 – 2015-12-14 (×12): 5 mg via ORAL
  Filled 2015-12-03 (×12): qty 1

## 2015-12-03 MED ORDER — ARIPIPRAZOLE 2 MG PO TABS
2.0000 mg | ORAL_TABLET | Freq: Every day | ORAL | Status: DC
  Administered 2015-12-04 – 2015-12-06 (×3): 2 mg via ORAL
  Filled 2015-12-03 (×6): qty 1

## 2015-12-03 NOTE — Plan of Care (Signed)
Problem: Alteration in mood Goal: LTG-Patient reports reduction in suicidal thoughts (Patient reports reduction in suicidal thoughts and is able to verbalize a safety plan for whenever patient is feeling suicidal)  Outcome: Not Progressing Patient continues to express passive SI.  She does contract for safety on the unit.

## 2015-12-03 NOTE — Progress Notes (Signed)
Pt reports her day, for the most part, was ok.  She denies SI/HI/AVH.  She says she has been going to groups.  Writer discussed her medications scheduled for tonight, and she stated that she had spoke to the MD about changing her sleep aid.  No new orders were found and pt was informed.  She said that she would speak to the MD on Thursday about it, but would take the Trazodone again tonight.  Pt plans to return to her home at discharge.  She has been observed in the dayroom most of the evening watching TV or talking with peers.  Pt makes her needs known to staff.  Support and encouragement offered.  Safety maintained with q15 minute checks.

## 2015-12-03 NOTE — [facility] Group Notes (Signed)
[facility] [employer] Group Therapy 12/03/2015 1:15pm  Type of Therapy: [employer] Presentation  Participation Level: Active  Participation Quality: Attentive  Affect: Appropriate  Cognitive: Oriented  Insight: Developing/Improving  Engagement in Therapy: Engaged  Modes of Intervention: Discussion, Education and Socialization  Summary of Progress/Problems: [employer] (M[employer]) Speaker came to talk about his personal journey with substance abuse and addiction. The pt processed ways by which to relate to the speaker. MHA speaker provided handouts and educational information pertaining to groups and services offered by the MHA. Pt was engaged in speaker's presentation and was receptive to resources provided.    [name], LCSWA 12/03/2015 1:49 PM  

## 2015-12-03 NOTE — Progress Notes (Signed)
D: Patient appears tearful and tired.  She states, "the doctor was supposed to put me on sleep medication and I didn't sleep last night."  She presents with sad, depressed mood; her affect is flat.  Patient continues to have passive SI, however, she contracts for safety on the unit.  She denies HI/AVH.  Her goal today is to "get through the day."  She also states, "I just don't feel like being here on earth."   A: Continue to monitor medication management and MD orders.  Safety checks completed every 15 minute per protocol.  Offer support and encouragement as needed. R: Patient is receptive to staff; her behavior is appropriate.

## 2015-12-03 NOTE — Progress Notes (Signed)
Patient ID: Valerie Woodard, female   DOB: [birth year], 50 y.o.   MRN: [id number] [facility] MD Progress Note  12/03/2015 4:01 PM Valerie Woodard  MRN:  [id number] Subjective:   Patient continues to endorse depression and thoughts of preferring to die and  states " I would just like to  be with my mother, I just miss her so much".  She has had suicidal ideations of hurting self when she returns home, but has contracted for safety on unit. Denies medication side effects, except for vivid dreams from Trazodone. States " it does not seem to work too well for me , I still wake up, but I don't want to go up on the dose because I don't want the dreams to get worse ".    Objective : I have discussed case with treatment team. Patient presents depressed, sad, but there is improvement compared to admission- she is less tearful, affect is more reactive, and although still ruminative and expressing passive SI, is also more future oriented. States " if I can get the house fixed, then maybe I can get my dog back " Explains her pet dog is currently with her daughter . Has been started on Synthroid due to elevated TSH . Appreciate hospitalist consult . As above, reports  Vivid dreams from Trazodone and is hoping to change this medication. Tolerating Cymbalta, Neurontin,  and Abilify well thus far - denies side effects.  Does not present with or endorse psychotic symptoms. Labs FT3 2.2 ( low normal) and FT4 0.59 ( low )       Principal Problem: Major depressive disorder (HCC) Diagnosis:   Patient Active Problem List   Diagnosis Date Noted  . Hypothyroidism [E03.9] 12/02/2015  . Major depressive disorder (HCC) [F32.9] 11/29/2015  . 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 Metabolic syndrome [E88.81] 04/27/2014  . Other and unspecified hyperlipidemia [E78.5] 04/27/2014  . DM (diabetes mellitus) (HCC) [E11.9] 04/27/2014  . Pancreatitis [K85.9] 04/23/2014  . Acute pancreatitis [K85.90] 04/23/2014  . Chest pain [R07.9]  04/23/2014  . Chronic pain syndrome [G89.4] 04/23/2014  . Hepatic steatosis [K76.0] 04/23/2014   Total Time spent with patient: 20 minutes     Past Medical History:  Past Medical History  Diagnosis Date  . Opioid use disorder, severe, in sustained remission, on maintenance therapy 2013    on Methadone maintenance  . Vaginal delivery ~ 1993    dtr 50 yo as of 04/2014  . Chronic pain decades    mostly spinal  . Obesity 2015    230#, 104 kg, BMI 33 as of 04/2014  . DM (diabetes mellitus) (HCC) 04/27/2014    Past Surgical History  Procedure Laterality Date  . Knee arthroscopy Right ~ 1995    twice.   Family History: History reviewed. No pertinent family history.  Social History:  History  Alcohol Use No     History  Drug Use No    Social History   Social History  . Marital Status: Divorced    Spouse Name: N/A  . Number of Children: N/A  . Years of Education: N/A   Social History Main Topics  . Smoking status: Current Every Day Smoker -- 2.00 packs/day  . Smokeless tobacco: None  . Alcohol Use: No  . Drug Use: No  . Sexual Activity: Not Asked   Other Topics Concern  . None   Social History Narrative   Additional Social History:   Sleep: improved   Appetite:  Fair  Current Medications:  Current Facility-Administered Medications  Medication Dose Route Frequency Provider Last Rate Last Dose  . acetaminophen (TYLENOL) tablet 650 mg  650 mg Oral Q6H PRN Kristeen Mans, NP   650 mg at 12/03/15 4540  . alum & mag hydroxide-simeth (MAALOX/MYLANTA) 200-200-20 MG/5ML suspension 30 mL  30 mL Oral Q4H PRN Kristeen Mans, NP      . ARIPiprazole (ABILIFY) tablet 2 mg  2 mg Oral Daily Craige Cotta, MD   2 mg at 12/03/15 0744  . DULoxetine (CYMBALTA) DR capsule 60 mg  60 mg Oral Daily Craige Cotta, MD   60 mg at 12/03/15 0743  . gabapentin (NEURONTIN) capsule 200 mg  200 mg Oral BID Craige Cotta, MD   200 mg at 12/03/15 0743  . hydrOXYzine (ATARAX/VISTARIL) tablet  25 mg  25 mg Oral TID PRN Oneta Rack, NP   25 mg at 12/03/15 1512  . levothyroxine (SYNTHROID, LEVOTHROID) tablet 50 mcg  50 mcg Oral Q breakfast Craige Cotta, MD   50 mcg at 12/03/15 0744  . magnesium hydroxide (MILK OF MAGNESIA) suspension 30 mL  30 mL Oral Daily PRN Kristeen Mans, NP      . nicotine (NICODERM CQ - dosed in mg/24 hours) patch 21 mg  21 mg Transdermal Daily Craige Cotta, MD   21 mg at 12/03/15 0744  . zolpidem (AMBIEN) tablet 5 mg  5 mg Oral QHS Craige Cotta, MD        Lab Results:  Results for orders placed or performed during the hospital encounter of 11/28/15 (from the past 48 hour(s))  T3, free     Status: None   Collection Time: 12/02/15  6:45 PM  Result Value Ref Range   T3, Free 2.2 2.0 - 4.4 pg/mL    Comment: (NOTE) Performed At: [facility] [address] [place], [place] [id number] Mila Homer MD [id number] Performed at [facility]   T4, free     Status: Abnormal   Collection Time: 12/02/15  6:45 PM  Result Value Ref Range   Free T4 0.59 (L) 0.61 - 1.12 ng/dL    Comment: Performed at [facility]    Physical Findings: AIMS: Facial and Oral Movements Muscles of Facial Expression: None, normal Lips and Perioral Area: None, normal Jaw: None, normal Tongue: None, normal,Extremity Movements Upper (arms, wrists, hands, fingers): None, normal Lower (legs, knees, ankles, toes): None, normal, Trunk Movements Neck, shoulders, hips: None, normal, Overall Severity Severity of abnormal movements (highest score from questions above): None, normal Incapacitation due to abnormal movements: None, normal Patient's awareness of abnormal movements (rate only patient's report): No Awareness, Dental Status Current problems with teeth and/or dentures?: No Does patient usually wear dentures?: No  CIWA:  CIWA-Ar Total: 1 COWS:  COWS Total Score: 1  Musculoskeletal: Strength & Muscle Tone: within normal  limits Gait & Station: normal Patient leans: N/A  Psychiatric Specialty Exam: ROS describes hair loss, denies chest pain, no shortness of breath , no vomiting , no dysuria or urgency  Blood pressure 150/92, pulse 77, temperature 98 F (36.7 C), temperature source Oral, resp. rate 12, height  (1.702 m), weight 203 lb (92.08 kg), SpO2 96 %.Body mass index is 31.79 kg/(m^2).  General Appearance: improved grooming   Eye Contact::  Good  Speech:  Normal Rate  Volume:  Normal  Mood:  Depressed  Affect:  Constricted but less tearful, smiles at times appropriately  Thought Process:  Linear  Orientation:  Other:  fully alert and attentive   Thought Content:  denies hallucinations, no delusions , not internally preoccupied   Suicidal Thoughts: passive thoughts of wanting to die , ruminates about deceased mother, denies any current plan or intention of suicide but does not feel safe returning home at this time. Contracts for safety on unit   Homicidal Thoughts:  No  Memory:  recent and remote grossly intact   Judgement:  Fair  Insight:  Fair  Psychomotor Activity:  Normal- more visible on unit   Concentration:  Good  Recall:  Good  Fund of Knowledge:Good  Language: Good  Akathisia:  Negative  Handed:  Right  AIMS (if indicated):     Assets:  Communication Skills Desire for Improvement Resilience  ADL's:  Intact  Cognition: WNL  Sleep:  Number of Hours: 6.75  Assessment-   Remains depressed, sad, ruminative about stressors and losses. Yesterday had  expressed suicidal ideation of pulling entertainment system on herself at home, today reporting passive SI, with ruminations about losing her mother, but has denied any plan or intention of hurting self on unit and contracts for safety. Wants to change Trazodone to another sleeping medication as it is causing vivid dreams - we discussed options and she agrees to Ambien. Tolerating Cymbalta well . Diagnosed with Hypothyroidism which may be  contributing to depression- started on Synthroid .  Treatment Plan Summary: Daily contact with patient to assess and evaluate symptoms and progress in treatment, Medication management, Plan inpatient admission and medications as below  Encourage ongoing  milieu and group participation to work on coping skills and symptom reduction  Continue Cymbalta  60  mgrs QDAY for depression Continue Abilify 2 mgrs QDAY as augmentation Increase  Neurontin  To 300  mgrs BID for management of anxiety and pain  D/C Trazodone - see above  Start Ambien 5 mgrs QHS for insomnia     Nehemiah Massed, MD 12/03/2015, 4:01 PM

## 2015-12-03 NOTE — Progress Notes (Signed)
Adult Psychoeducational Group Note  Date:  12/03/2015 Time:  9:10 PM  Group Topic/Focus:  Wrap-Up Group:   The focus of this group is to help patients review their daily goal of treatment and discuss progress on daily workbooks.  Participation Level:  Active  Participation Quality:  Appropriate  Affect:  Appropriate  Cognitive:  Alert  Insight: Appropriate  Engagement in Group:  Engaged  Modes of Intervention:  Problem-solving  Additional Comments:  Tuana was shy at first to start the group off but she engaged.  She shared with the group that today was a better day and she is hoping to feel better on tomorrow.    Annell Greening [place] 12/03/2015, 9:10 PM

## 2015-12-03 NOTE — [facility] Group Notes (Signed)
[facility] Group Notes:  (Nursing/MHT/Case Management/Adjunct)  Date:  12/03/2015  Time:  0830 am  Type of Therapy:  Psychoeducational Skills  Participation Level:  Minimal  Participation Quality:  Appropriate and Attentive  Affect:  Appropriate  Cognitive:  Appropriate  Insight:  Improving  Engagement in Group:  Resistant  Modes of Intervention:  Support  Summary of Progress/Problems: Patient were asked what they liked and disliked about themselves.  Valerie Woodard states she likes the fact that she helps animals.  She dislikes staying depressed all the time.  Cranford Mon 12/03/2015, 10:50 AM

## 2015-12-04 NOTE — Progress Notes (Signed)
[facility] Group Notes:  (Nursing/MHT/Case Management/Adjunct)  Date:  12/04/2015  Time:  11:25 PM  Type of Therapy:  Psychoeducational Skills  Participation Level:  Active  Participation Quality:  Appropriate  Affect:  Flat  Cognitive:  Appropriate  Insight:  Appropriate  Engagement in Group:  Developing/Improving  Modes of Intervention:  Education  Summary of Progress/Problems: Patient verbalized that her day was "not so good". She explained that she spent a lot of time blaming herself for the passing of both of her parents. She was unable to state a relapse prevention strategy for the theme of the day.   Hazle Coca S 12/04/2015, 11:25 PM

## 2015-12-04 NOTE — [facility] Group Notes (Signed)
[facility] LCSW Aftercare Discharge Planning Group Note  12/04/2015 8:45 AM  Participation Quality: Alert, Appropriate and Oriented  Mood/Affect: Flat; Withdrawn  Depression Rating: 8  Anxiety Rating: 8  Thoughts of Suicide: Pt endorses SI  Will you contract for safety? Yes  Current AVH: Pt denies  Plan for Discharge/Comments: Pt attended discharge planning group and actively participated in group. CSW discussed suicide prevention education with the group and encouraged them to discuss discharge planning and any relevant barriers. Pt volume of speech is low and difficult to discern. Pt reports minimal progress and is observed to have poor eye contact.  Transportation Means: Pt reports access to transportation  Supports: No supports mentioned at this time  Chad Cordial, LCSWA 12/04/2015 9:40 AM
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
## 2015-12-04 NOTE — Progress Notes (Signed)
Patient ID: Valerie Woodard, female   DOB: July 05, 1966, 50 y.o.   MRN: [id number] [facility] MD Progress Note  12/04/2015 5:31 PM Valerie Woodard  MRN:  [id number] Subjective:    Remains depressed, sad, and states she still feels sad and  has been  " crying a lot ". Denies medication side effects. Continues to deny suicidal plan or intent at this time , and states she feels safe and supported on unit, but continues  report thoughts of committing suicide after returning to her  Home, " because I miss my mother and I feel no one would miss me".   Objective : I have discussed case with treatment team. Remains depressed, sad, intermittently tearful but some improvement noted- she is more interactive with peers and noted to be socializing and smiling when interacting with other peers of about her age . Also, expressing some hope that medications will " lift this depression", as opposed to hopelessness and pessimism expressed earlier this admission. States " maybe if my thyroid function has been low and now that is being treated, it will make me feel better". Affect more responsive to support, empathy, and review of medications. We also discussed ECT as an eventual alternative for resistant depression. She stated she would be interested in this treatment modality " if I don't start to feel better soon".  Denies medication side effects Tolerating Ambien trial well- slept better last night  No disruptive or agitated behaviors on unit. Denies any suicidal plan or intention on unit, contracts for safety. Suicidal ideations she expressed are related to returning to her home and facing  her stressors, loneliness again. I have encouraged her to increase contact with daughter and consider activities to limit isolation such as volunteering .    Principal Problem: Major depressive disorder (HCC) Diagnosis:   Patient Active Problem List   Diagnosis Date Noted  . Hypothyroidism [E03.9] 12/02/2015  . Major depressive disorder  (HCC) [F32.9] 11/29/2015  . 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 Metabolic syndrome [E88.81] 04/27/2014  . Other and unspecified hyperlipidemia [E78.5] 04/27/2014  . DM (diabetes mellitus) (HCC) [E11.9] 04/27/2014  . Pancreatitis [K85.9] 04/23/2014  . Acute pancreatitis [K85.90] 04/23/2014  . Chest pain [R07.9] 04/23/2014  . Chronic pain syndrome [G89.4] 04/23/2014  . Hepatic steatosis [K76.0] 04/23/2014   Total Time spent with patient: 25 minutes     Past Medical History:  Past Medical History  Diagnosis Date  . Opioid use disorder, severe, in sustained remission, on maintenance therapy 2013    on Methadone maintenance  . Vaginal delivery ~ 1993    dtr 50 yo as of 04/2014  . Chronic pain decades    mostly spinal  . Obesity 2015    230#, 104 kg, BMI 33 as of 04/2014  . DM (diabetes mellitus) (HCC) 04/27/2014    Past Surgical History  Procedure Laterality Date  . Knee arthroscopy Right ~ 1995    twice.   Family History: History reviewed. No pertinent family history.  Social History:  History  Alcohol Use No     History  Drug Use No    Social History   Social History  . Marital Status: Divorced    Spouse Name: N/A  . Number of Children: N/A  . Years of Education: N/A   Social History Main Topics  . Smoking status: Current Every Day Smoker -- 2.00 packs/day  . Smokeless tobacco: None  . Alcohol Use: No  . Drug Use: No  . Sexual Activity: Not  Asked   Other Topics Concern  . None   Social History Narrative   Additional Social History:   Sleep: improved   Appetite:   Improving   Current Medications: Current Facility-Administered Medications  Medication Dose Route Frequency Provider Last Rate Last Dose  . acetaminophen (TYLENOL) tablet 650 mg  650 mg Oral Q6H PRN Kristeen Mans, NP   650 mg at 12/03/15 1610  . alum & mag hydroxide-simeth (MAALOX/MYLANTA) 200-200-20 MG/5ML suspension 30 mL  30 mL Oral Q4H PRN Kristeen Mans, NP      . ARIPiprazole  (ABILIFY) tablet 2 mg  2 mg Oral Daily Craige Cotta, MD   2 mg at 12/04/15 0855  . DULoxetine (CYMBALTA) DR capsule 60 mg  60 mg Oral Daily Craige Cotta, MD   60 mg at 12/04/15 0855  . gabapentin (NEURONTIN) capsule 300 mg  300 mg Oral BID Craige Cotta, MD   300 mg at 12/04/15 1714  . hydrOXYzine (ATARAX/VISTARIL) tablet 25 mg  25 mg Oral TID PRN Oneta Rack, NP   25 mg at 12/04/15 1346  . levothyroxine (SYNTHROID, LEVOTHROID) tablet 50 mcg  50 mcg Oral Q breakfast Craige Cotta, MD   50 mcg at 12/04/15 0856  . magnesium hydroxide (MILK OF MAGNESIA) suspension 30 mL  30 mL Oral Daily PRN Kristeen Mans, NP      . nicotine (NICODERM CQ - dosed in mg/24 hours) patch 21 mg  21 mg Transdermal Daily Craige Cotta, MD   21 mg at 12/04/15 0856  . zolpidem (AMBIEN) tablet 5 mg  5 mg Oral QHS Craige Cotta, MD   5 mg at 12/03/15 2201    Lab Results:  Results for orders placed or performed during the hospital encounter of 11/28/15 (from the past 48 hour(s))  T3, free     Status: None   Collection Time: 12/02/15  6:45 PM  Result Value Ref Range   T3, Free 2.2 2.0 - 4.4 pg/mL    Comment: (NOTE) Performed At: [facility] [address] [place], [place] [id number] Mila Homer MD [id number] Performed at [facility]   T4, free     Status: Abnormal   Collection Time: 12/02/15  6:45 PM  Result Value Ref Range   Free T4 0.59 (L) 0.61 - 1.12 ng/dL    Comment: Performed at [facility]    Physical Findings: AIMS: Facial and Oral Movements Muscles of Facial Expression: None, normal Lips and Perioral Area: None, normal Jaw: None, normal Tongue: None, normal,Extremity Movements Upper (arms, wrists, hands, fingers): None, normal Lower (legs, knees, ankles, toes): None, normal, Trunk Movements Neck, shoulders, hips: None, normal, Overall Severity Severity of abnormal movements (highest score from questions above): None,  normal Incapacitation due to abnormal movements: None, normal Patient's awareness of abnormal movements (rate only patient's report): No Awareness, Dental Status Current problems with teeth and/or dentures?: No Does patient usually wear dentures?: No  CIWA:  CIWA-Ar Total: 1 COWS:  COWS Total Score: 1  Musculoskeletal: Strength & Muscle Tone: within normal limits Gait & Station: normal Patient leans: N/A  Psychiatric Specialty Exam: ROS describes hair loss, denies chest pain, no shortness of breath , no vomiting , no dysuria or urgency  Blood pressure 129/88, pulse 84, temperature 97.9 F (36.6 C), temperature source Oral, resp. rate 16, height 5\' 7"  (1.702 m), weight 203 lb (92.08 kg), SpO2 96 %.Body mass index is 31.79 kg/(m^2).  General  Appearance: improved grooming   Eye Contact::  Good  Speech:  Normal Rate  Volume:  Normal  Mood:  Still depressed   Affect:  Constricted , reactive at times, does smile at times, but often tearful  Thought Process:  Linear  Orientation:  Other:  fully alert and attentive   Thought Content:  denies hallucinations, no delusions , not internally preoccupied   Suicidal Thoughts: passive thoughts of wanting to die- denies any current plan or intention of suicide but does not feel safe returning home at this time. Contracts for safety on unit   Homicidal Thoughts:  No  Memory:  recent and remote grossly intact   Judgement:  Improving   Insight:  Improving   Psychomotor Activity:  Normal- more visible on unit   Concentration:  Good  Recall:  Good  Fund of Knowledge:Good  Language: Good  Akathisia:  Negative  Handed:  Right  AIMS (if indicated):     Assets:  Communication Skills Desire for Improvement Resilience  ADL's:  Intact  Cognition: WNL  Sleep:  Number of Hours: 6.75  Assessment-   Remains depressed, sad, intermittently tearful. Some improvement noted particularly insofar as being more interactive with peers and presenting less  hopeless. Thus far tolerating medications well. No SI on unit,  And able to contract for safety in this setting, but continues to report suicidal ideations, with a plan of pulling heavy objects over herself, when  She returns home.   Treatment Plan Summary: Daily contact with patient to assess and evaluate symptoms and progress in treatment, Medication management, Plan inpatient admission and medications as below  Encourage ongoing  milieu and group participation to work on coping skills and symptom reduction  Continue Cymbalta  60  mgrs QDAY for depression Continue Abilify 2 mgrs QDAY as augmentation Continue  Neurontin  300  mgrs BID for management of anxiety and pain  Continue Ambien 5 mgrs QHS for insomnia     Valerie Woodard, Madaline Guthrie, MD 12/04/2015, 5:31 PM

## 2015-12-04 NOTE — Progress Notes (Signed)
Pt reports that she has felt anxious this afternoon, but that it has been better today than yesterday.  She did tell Clinical research associate about a weird dream she had last night that she was in a huge aquarium being chased by an algae eater.  She thinks it was the Trazodone, but she has also been wearing her nicotine patch to bed.  Writer suggested to pt that she take the patch off before going to bed to see if that helped stop the dreams.  Pt was agreeable.  Pt voiced no other needs or concerns.  Support and encouragement offered.  Discharge plans are in process.  Safety maintained with q15 minute checks.

## 2015-12-04 NOTE — [facility] Group Notes (Signed)
[facility] LCSW Group Therapy 12/04/2015 1:15pm  Type of Therapy: Group Therapy- Feelings Around Relapse and Recovery  Participation Level: Minimal  Participation Quality:  Attentive  Affect:  Flat  Cognitive: Alert and Oriented   Insight:  Developing   Engagement in Therapy: Limited  Modes of Intervention: Clarification, Confrontation, Discussion, Education, Exploration, Limit-setting, Orientation, Problem-solving, Rapport Building, Dance movement psychotherapist, Socialization and Support  Summary of Progress/Problems: The topic for today was feelings about relapse. The group discussed what relapse prevention is to them and identified triggers that they are on the path to relapse. Members also processed their feeling towards relapse and were able to relate to common experiences. Group also discussed coping skills that can be used for relapse prevention.  Pt attends group but does not participate. Although, she was observed to be more attentive to discussion.    Therapeutic Modalities:   Cognitive Behavioral Therapy Solution-Focused Therapy Assertiveness Training Relapse Prevention Therapy    Lamar Sprinkles [phone] 12/04/2015 4:31 PM

## 2015-12-05 NOTE — Progress Notes (Signed)
Patient seen on dayroom watching TV and interacting with peers. Denies pain, SI, AH/VH at this time. Patient made no new complaint. Accepted her medications and attended group meeting. Will continue to monitor patient for safety and stability.

## 2015-12-05 NOTE — Plan of Care (Signed)
Problem: Alteration in mood Goal: LTG-Pt's behavior demonstrates decreased signs of depression (Patient's behavior demonstrates decreased signs of depression to the point the patient is safe to return home and continue treatment in an outpatient setting)  Outcome: Not Progressing Pt continues to be sad flat, endorses depression as well as SI  Problem: Alteration in mood; excessive anxiety as evidenced by: Goal: LTG-Patient's behavior demonstrates decreased anxiety (Patient's behavior demonstrates anxiety and he/she is utilizing learned coping skills to deal with anxiety-producing situations)  Outcome: Not Progressing Pt continues to request anxiety medication

## 2015-12-05 NOTE — Progress Notes (Signed)
[facility] Group Notes:  (Nursing/MHT/Case Management/Adjunct)  Date:  12/05/2015  Time:  11:35 PM  Type of Therapy:  Psychoeducational Skills  Participation Level:  Active  Participation Quality:  Appropriate  Affect:  Depressed  Cognitive:  Appropriate  Insight:  Improving  Engagement in Group:  Engaged  Modes of Intervention:  Education  Summary of Progress/Problems: The patient stated that she had a better day since she went outside and because her medications were adjusted. In terms of the theme for the day, her coping skill will be to start working on coming to terms with the losses in her life.   Valerie Woodard 12/05/2015, 11:35 PM

## 2015-12-05 NOTE — Progress Notes (Signed)
Patient seen interacting and socializing with peers on dayroom. Complained of anxiety and requested for "Vistaril". Denies pain, SI, AH/VH at this time. Due/PRN medications given as ordered. No further complaint. No behavioral issues noted. Safety maintained at all time. Will continue to monitor and observe patient

## 2015-12-05 NOTE — Progress Notes (Signed)
Pt reports poor sleep. Did request medication, reports it was not helpful. Endorses depression, hopelessness and anxiety at 10. Complaints of headache throughout shift, prn given with partial relief. Pt goal is to just get thru today. Encouragement and support offered. prns given for anxiety and relief. Med and group compliant. Appropriate with staff and peers. Pt receptive and remains safe on unit with q 15 min checks.

## 2015-12-05 NOTE — [facility] Group Notes (Signed)
[facility] LCSW Group Therapy  12/05/2015  10:30 AM to 11:30 PM  Type of Therapy:  Group Therapy  Participation Level: Minimal  Participation Quality: Hesitant  Affect:  Anxious  Cognitive: Alert and oriented  Insight: Limited  Engagement in Therapy:  Minimal  Modes of Intervention:  Discussion, Exploration, Problem-solving, Rapport Building, Socialization and Support   Summary of Progress/Problems: The main focus of today's process group was for the patient to identify ways in which they have in the past sabotaged their own recovery. Motivational Interviewing was utilized to ask the group members what they get out of their self sabotaging behaviors, and what reasons they may have for wanting to change. The Stages of Change were explained using a handout, and patients identified where they currently are with regard to stages of change. Patient unable to identify where she is in stage of change model and reported "I'm simply in survival mode." Patient was observed in multiple side conversations yet was hesitant during her own opportunities to share and spoke softly.   Carney Bern, LCSW

## 2015-12-05 NOTE — [facility] Group Notes (Signed)
[facility] Group Notes:  (Nursing/MHT/Case Management/Adjunct)  Date:  12/05/2015  Time:  0900 am  Type of Therapy:  Psychoeducational Skills  Participation Level:  Active  Participation Quality:  Appropriate and Attentive  Affect:  Anxious  Cognitive:  Alert and Appropriate  Insight:  Improving  Engagement in Group:  Supportive  Modes of Intervention:  Support  Summary of Progress/Problems:  Patient states she "spends time with her pets to help deal with my stress."  Cranford Mon 12/05/2015, 10:17 AM

## 2015-12-05 NOTE — Progress Notes (Signed)
[facility] MD Progress Note  12/05/2015 9:55 AM Valerie Woodard  MRN:  [id number] Subjective:    Patient reports " I cant sleep my roommates snores too loud."  Objective: Valerie Woodard is awake, alert and oriented X3 , found attending group session. Denies suicidal or homicidal ideation. Denies auditory or visual hallucination and does not appear to be responding to internal stimuli. Patients reports  interacting well with staff and others. States she is able to talk better one to one. Patient reports she is medication compliant without mediation side effects. States her depression 8/10. Patient states "I feel okay, I was just unable to sleep well."  Reports good appetite. State not resting well at night. Patient appears depressed, sad with concerns of ECT therapy.   Support, encouragement and reassurance was provided.     Principal Problem: Major depressive disorder (HCC) Diagnosis:   Patient Active Problem List   Diagnosis Date Noted  . Hypothyroidism [E03.9] 12/02/2015  . Major depressive disorder (HCC) [F32.9] 11/29/2015  . 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 Metabolic syndrome [E88.81] 04/27/2014  . Other and unspecified hyperlipidemia [E78.5] 04/27/2014  . DM (diabetes mellitus) (HCC) [E11.9] 04/27/2014  . Pancreatitis [K85.9] 04/23/2014  . Acute pancreatitis [K85.90] 04/23/2014  . Chest pain [R07.9] 04/23/2014  . Chronic pain syndrome [G89.4] 04/23/2014  . Hepatic steatosis [K76.0] 04/23/2014   Total Time spent with patient: 25 minutes     Past Medical History:  Past Medical History  Diagnosis Date  . Opioid use disorder, severe, in sustained remission, on maintenance therapy 2013    on Methadone maintenance  . Vaginal delivery ~ 1993    dtr 50 yo as of 04/2014  . Chronic pain decades    mostly spinal  . Obesity 2015    230#, 104 kg, BMI 33 as of 04/2014  . DM (diabetes mellitus) (HCC) 04/27/2014    Past Surgical History  Procedure Laterality Date  . Knee arthroscopy Right ~  1995    twice.   Family History: History reviewed. No pertinent family history.  Social History:  History  Alcohol Use No     History  Drug Use No    Social History   Social History  . Marital Status: Divorced    Spouse Name: N/A  . Number of Children: N/A  . Years of Education: N/A   Social History Main Topics  . Smoking status: Current Every Day Smoker -- 2.00 packs/day  . Smokeless tobacco: None  . Alcohol Use: No  . Drug Use: No  . Sexual Activity: Not Asked   Other Topics Concern  . None   Social History Narrative   Additional Social History:   Sleep: improved   Appetite:   fair  Current Medications: Current Facility-Administered Medications  Medication Dose Route Frequency Provider Last Rate Last Dose  . acetaminophen (TYLENOL) tablet 650 mg  650 mg Oral Q6H PRN Kristeen Mans, NP   650 mg at 12/05/15 0551  . alum & mag hydroxide-simeth (MAALOX/MYLANTA) 200-200-20 MG/5ML suspension 30 mL  30 mL Oral Q4H PRN Kristeen Mans, NP      . ARIPiprazole (ABILIFY) tablet 2 mg  2 mg Oral Daily Craige Cotta, MD   2 mg at 12/05/15 0826  . DULoxetine (CYMBALTA) DR capsule 60 mg  60 mg Oral Daily Craige Cotta, MD   60 mg at 12/05/15 0826  . gabapentin (NEURONTIN) capsule 300 mg  300 mg Oral BID Craige Cotta, MD   300 mg  at 12/05/15 0826  . hydrOXYzine (ATARAX/VISTARIL) tablet 25 mg  25 mg Oral TID PRN Oneta Rack, NP   25 mg at 12/04/15 1346  . levothyroxine (SYNTHROID, LEVOTHROID) tablet 50 mcg  50 mcg Oral Q breakfast Craige Cotta, MD   50 mcg at 12/05/15 0826  . magnesium hydroxide (MILK OF MAGNESIA) suspension 30 mL  30 mL Oral Daily PRN Kristeen Mans, NP      . nicotine (NICODERM CQ - dosed in mg/24 hours) patch 21 mg  21 mg Transdermal Daily Craige Cotta, MD   21 mg at 12/05/15 0825  . zolpidem (AMBIEN) tablet 5 mg  5 mg Oral QHS Craige Cotta, MD   5 mg at 12/04/15 2127    Lab Results:  No results found for this or any previous visit (from  the past 48 hour(s)).  Physical Findings: AIMS: Facial and Oral Movements Muscles of Facial Expression: None, normal Lips and Perioral Area: None, normal Jaw: None, normal Tongue: None, normal,Extremity Movements Upper (arms, wrists, hands, fingers): None, normal Lower (legs, knees, ankles, toes): None, normal, Trunk Movements Neck, shoulders, hips: None, normal, Overall Severity Severity of abnormal movements (highest score from questions above): None, normal Incapacitation due to abnormal movements: None, normal Patient's awareness of abnormal movements (rate only patient's report): No Awareness, Dental Status Current problems with teeth and/or dentures?: No Does patient usually wear dentures?: No  CIWA:  CIWA-Ar Total: 1 COWS:  COWS Total Score: 1  Musculoskeletal: Strength & Muscle Tone: within normal limits Gait & Station: normal Patient leans: N/A  Psychiatric Specialty Exam: Review of Systems  Psychiatric/Behavioral: Positive for depression. Negative for suicidal ideas and hallucinations. The patient is nervous/anxious and has insomnia.   All other systems reviewed and are negative.  describes hair loss, denies chest pain, no shortness of breath , no vomiting , no dysuria or urgency  Blood pressure 129/76, pulse 91, temperature 97.9 F (36.6 C), temperature source Oral, resp. rate 16, height 5\' 7"  (1.702 m), weight 92.08 kg (203 lb), SpO2 96 %.Body mass index is 31.79 kg/(m^2).  General Appearance: casual, pleasant clam and cooperative    Eye Contact::  Good  Speech:  Normal Rate  Volume:  Normal  Mood:  Still depressed   Affect:  Constricted , reactive at times, does smile at times, but often tearful  Thought Process:  Linear  Orientation:  Other:  fully alert and attentive   Thought Content:  denies hallucinations, no delusions , not internally preoccupied   Suicidal Thoughts: passive thoughts of wanting to die. Contracts for safety on unit   Homicidal Thoughts:  No   Memory:  recent and remote grossly intact   Judgement:  Improving   Insight:  Improving   Psychomotor Activity:  Normal  Concentration:  Good  Recall:  Good  Fund of Knowledge:Good  Language: Good  Akathisia:  Negative  Handed:  Right  AIMS (if indicated):     Assets:  Communication Skills Desire for Improvement Resilience  ADL's:  Intact  Cognition: WNL  Sleep:  Number of Hours: 6.25   I agree with current treatment plan on 12/05/2015 Patient seen face-to-face for psychiatric evaluation follow-up, chart reviewed. Reviewed the information documented and agree with the treatment plan.  Treatment Plan Summary:  Daily contact with patient to assess and evaluate symptoms and progress in treatment, Medication management, Plan inpatient admission and medications as below  Encourage ongoing  milieu and group participation to work on coping skills and  symptom reduction  Continue Cymbalta  60  mgrs QDAY for depression Continue Abilify 2 mgrs QDAY as augmentation Continue  Neurontin  300  mgrs BID for management of anxiety and pain  Continue Ambien 5 mgrs QHS for insomnia  Ordered earplug for QHS uses only Oneta Rack, NP 12/05/2015, 9:55 AM

## 2015-12-06 DIAGNOSIS — F322 Major depressive disorder, single episode, severe without psychotic features: Secondary | ICD-10-CM | POA: Insufficient documentation

## 2015-12-06 MED ORDER — ASPIRIN-ACETAMINOPHEN-CAFFEINE 250-250-65 MG PO TABS
2.0000 | ORAL_TABLET | Freq: Once | ORAL | Status: AC
  Administered 2015-12-06: 2 via ORAL
  Filled 2015-12-06 (×2): qty 2

## 2015-12-06 MED ORDER — ARIPIPRAZOLE 5 MG PO TABS
5.0000 mg | ORAL_TABLET | Freq: Every day | ORAL | Status: DC
  Administered 2015-12-07 – 2015-12-15 (×9): 5 mg via ORAL
  Filled 2015-12-06 (×10): qty 1

## 2015-12-06 NOTE — Progress Notes (Signed)
[facility] Group Notes:  (Nursing/MHT/Case Management/Adjunct)  Date:  12/06/2015  Time:  11:27 PM  Type of Therapy:  Psychoeducational Skills  Participation Level:  Minimal  Participation Quality:  Attentive  Affect:  Flat  Cognitive:  Lacking  Insight:  Limited  Engagement in Group:  Limited  Modes of Intervention:  Education  Summary of Progress/Problems: The patient had little to share in group and would only mention that she went outdoors for fresh air. Her support system (theme of the day) consists of herself.   Hazle Coca S 12/06/2015, 11:27 PM

## 2015-12-06 NOTE — [facility] Group Notes (Addendum)
[facility] Group Notes:  (Nursing/MHT/Case Management/Adjunct)  Date:  12/06/2015  Time:  945am  Type of Therapy:  Nurse Education  Participation Level:  Active  Participation Quality:  Appropriate, Attentive and Sharing  Affect:  Appropriate  Cognitive:  Alert and Appropriate  Insight:  Appropriate  Engagement in Group:  Engaged and Supportive  Modes of Intervention:  Discussion and Education  Summary of Progress/Problems:  Group topic was [employer].  Discussed appropriate goal setting.  She was quiet during the group but was attentive.  Norm Parcel Roxas Clymer 12/06/2015, 10:52 AM

## 2015-12-06 NOTE — Progress Notes (Signed)
Patient seen on day room interacting with peers. Patient denies pain, SI, AH/VH  at this time. Made no new complaint. Patient stated "am ok". Accepted her bedtime medications. No behavioral issues noted. Every 15 minutes check for safety maintained. Will continue to monitor patient for safety and stability.

## 2015-12-06 NOTE — Progress Notes (Signed)
[facility] MD Progress Note  12/06/2015 1:22 PM Valerie Woodard  MRN:  [id number] Subjective:    Patient reports " I am feeling better than I did yesterday."  Objective: Minka Knight is awake, alert and oriented X3 , found sitting in the dayroom interacting with peers.. Denies suicidal or homicidal ideation. Denies auditory or visual hallucination and does not appear to be responding to internal stimuli. Patients reports  interacting well with staff and others. Patient reports she is learning to "open up to others." Patient reports she is medication compliant without mediation side effects. States her depression 9/10. Patient reports "I still get very tearful at times for unknown reasons." Patient states "I feel okay today" States that she was able to rest with the earplugs.  Reports good appetite. Patient appears depressed and constricted but pleasant. encouragement and reassurance was provided.     Principal Problem: Major depressive disorder (HCC) Diagnosis:   Patient Active Problem List   Diagnosis Date Noted  . Severe single current episode of major depressive disorder, without psychotic features (HCC) [F32.2]   . Hypothyroidism [E03.9] 12/02/2015  . Major depressive disorder (HCC) [F32.9] 11/29/2015  . 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 Metabolic syndrome [E88.81] 04/27/2014  . Other and unspecified hyperlipidemia [E78.5] 04/27/2014  . DM (diabetes mellitus) (HCC) [E11.9] 04/27/2014  . Pancreatitis [K85.9] 04/23/2014  . Acute pancreatitis [K85.90] 04/23/2014  . Chest pain [R07.9] 04/23/2014  . Chronic pain syndrome [G89.4] 04/23/2014  . Hepatic steatosis [K76.0] 04/23/2014   Total Time spent with patient: 25 minutes     Past Medical History:  Past Medical History  Diagnosis Date  . Opioid use disorder, severe, in sustained remission, on maintenance therapy 2013    on Methadone maintenance  . Vaginal delivery ~ 1993    dtr 50 yo as of 04/2014  . Chronic pain decades    mostly  spinal  . Obesity 2015    230#, 104 kg, BMI 33 as of 04/2014  . DM (diabetes mellitus) (HCC) 04/27/2014    Past Surgical History  Procedure Laterality Date  . Knee arthroscopy Right ~ 1995    twice.   Family History: History reviewed. No pertinent family history.  Social History:  History  Alcohol Use No     History  Drug Use No    Social History   Social History  . Marital Status: Divorced    Spouse Name: N/A  . Number of Children: N/A  . Years of Education: N/A   Social History Main Topics  . Smoking status: Current Every Day Smoker -- 2.00 packs/day  . Smokeless tobacco: None  . Alcohol Use: No  . Drug Use: No  . Sexual Activity: Not Asked   Other Topics Concern  . None   Social History Narrative   Additional Social History:   Sleep: improved   Appetite:   fair  Current Medications: Current Facility-Administered Medications  Medication Dose Route Frequency Provider Last Rate Last Dose  . acetaminophen (TYLENOL) tablet 650 mg  650 mg Oral Q6H PRN Kristeen Mans, NP   650 mg at 12/06/15 1134  . alum & mag hydroxide-simeth (MAALOX/MYLANTA) 200-200-20 MG/5ML suspension 30 mL  30 mL Oral Q4H PRN Kristeen Mans, NP      . ARIPiprazole (ABILIFY) tablet 2 mg  2 mg Oral Daily Craige Cotta, MD   2 mg at 12/06/15 0751  . DULoxetine (CYMBALTA) DR capsule 60 mg  60 mg Oral Daily Craige Cotta, MD  60 mg at 12/06/15 0751  . gabapentin (NEURONTIN) capsule 300 mg  300 mg Oral BID Craige Cotta, MD   300 mg at 12/06/15 0751  . hydrOXYzine (ATARAX/VISTARIL) tablet 25 mg  25 mg Oral TID PRN Oneta Rack, NP   25 mg at 12/06/15 1134  . levothyroxine (SYNTHROID, LEVOTHROID) tablet 50 mcg  50 mcg Oral Q breakfast Craige Cotta, MD   50 mcg at 12/06/15 0751  . magnesium hydroxide (MILK OF MAGNESIA) suspension 30 mL  30 mL Oral Daily PRN Kristeen Mans, NP      . nicotine (NICODERM CQ - dosed in mg/24 hours) patch 21 mg  21 mg Transdermal Daily Craige Cotta, MD   21  mg at 12/06/15 0751  . zolpidem (AMBIEN) tablet 5 mg  5 mg Oral QHS Craige Cotta, MD   5 mg at 12/05/15 2138    Lab Results:  No results found for this or any previous visit (from the past 48 hour(s)).  Physical Findings: AIMS: Facial and Oral Movements Muscles of Facial Expression: None, normal Lips and Perioral Area: None, normal Jaw: None, normal Tongue: None, normal,Extremity Movements Upper (arms, wrists, hands, fingers): None, normal Lower (legs, knees, ankles, toes): None, normal, Trunk Movements Neck, shoulders, hips: None, normal, Overall Severity Severity of abnormal movements (highest score from questions above): None, normal Incapacitation due to abnormal movements: None, normal Patient's awareness of abnormal movements (rate only patient's report): No Awareness, Dental Status Current problems with teeth and/or dentures?: No Does patient usually wear dentures?: No  CIWA:  CIWA-Ar Total: 1 COWS:  COWS Total Score: 1  Musculoskeletal: Strength & Muscle Tone: within normal limits Gait & Station: normal Patient leans: N/A  Psychiatric Specialty Exam: Review of Systems  Psychiatric/Behavioral: Positive for depression. Negative for suicidal ideas and hallucinations. The patient is nervous/anxious and has insomnia.   All other systems reviewed and are negative. denies chest pain, no shortness of breath , no vomiting , no dysuria or urgency  Blood pressure 125/69, pulse 89, temperature 98 F (36.7 C), temperature source Oral, resp. rate 16, height 5\' 7"  (1.702 m), weight 92.08 kg (203 lb), SpO2 96 %.Body mass index is 31.79 kg/(m^2).  General Appearance: casual, pleasant clam and cooperative    Eye Contact::  Good  Speech:  Normal Rate  Volume:  Normal  Mood:  Sad, flat and depressed (9/10)  Affect:  Constricted   Thought Process:  Linear  Orientation:  Full (Time, Place, and Person)  Thought Content:  denies hallucinations, no delusions , not internally  preoccupied   Suicidal Thoughts: passive thoughts of wanting to die. Contracts for safety on unit   Homicidal Thoughts:  No  Memory:  recent and remote grossly intact   Judgement:  Improving   Insight:  Improving   Psychomotor Activity:  Normal  Concentration:  Good  Recall:  Good  Fund of Knowledge:Good  Language: Good  Akathisia:  Negative  Handed:  Right  AIMS (if indicated):     Assets:  Communication Skills Desire for Improvement Resilience  ADL's:  Intact  Cognition: WNL  Sleep:  Number of Hours: 6   I agree with current treatment plan on 12/06/2015 Patient seen face-to-face for psychiatric evaluation follow-up, chart reviewed. Reviewed the information documented and agree with the treatment plan.  Treatment Plan Summary:  Daily contact with patient to assess and evaluate symptoms and progress in treatment, Medication management, Plan inpatient admission and medications as below  Encourage ongoing  milieu and group participation to work on Pharmacologist and symptom reduction  Continue Cymbalta  60 mg QDAY for depression Increased  Abilify 2 mgrs to Abilify s PO QDAY for mood stabilization as augmentation Continue  Neurontin  300  mgrs BID for management of anxiety and pain  Continue Ambien 5 mgrs QHS for insomnia -improving Ordered earplug for QHS uses only  Oneta Rack, NP 12/06/2015, 1:22 PM

## 2015-12-06 NOTE — Progress Notes (Signed)
Pt c/o increased anxiety, ongoing headache and palpitations. Pt stated that she believes she's having a panic attack due to ongoing headaches. Pt was given tylenol at 1134 am. Headache was not relieved by tylenol. Writer notified Tanika, NP., at 1425 of pt complaint. Tanika, Np., verbalized that she will meet with pt and f/u with pt complaint.

## 2015-12-06 NOTE — [facility] Group Notes (Signed)
   [facility] LCSW Group Therapy Note   12/06/2015 10:45 AM   Type of Therapy and Topic: Group Therapy: Feelings Around Returning Home & Establishing a Supportive Framework and Activity to Identify signs of Improvement or Decompensation   Participation Level: Active   Description of Group:  Patients first processed thoughts and feelings about up coming discharge. These included fears of upcoming changes, lack of change, new living environments, judgements and expectations from others and overall stigma of MH issues. We then discussed what is a supportive framework? What does it look like feel like and how do I discern it from and unhealthy non-supportive network? Learn how to cope when supports are not helpful and don't support you. Discuss what to do when your family/friends are not supportive.   Therapeutic Goals Addressed in Processing Group:  1. Patient will identify one healthy supportive network that they can use at discharge. 2. Patient will identify one factor of a supportive framework and how to tell it from an unhealthy network. 3. Patient able to identify one coping skill to use when they do not have positive supports from others. 4. Patient will demonstrate ability to communicate their needs through discussion and/or role plays.  Summary of Patient Progress:  Pt engaged more easily during group session today. As patients processed their anxiety about discharge and described healthy supports patient shared need to develop new supports as she experienced major losses last year.  Patient chose a visual to represent decompensation as loss  And she was unable/unwilling to choose a visit to represent  Improvement. Patient needed some redirection to avoid side conversations when focus was not on her.   Carney Bern, LCSW

## 2015-12-06 NOTE — Progress Notes (Signed)
Patient ID: Valerie Woodard, female   DOB: 02-29-1966, 50 y.o.   MRN: [id number]  Pt currently presents with a flat affect and anxious behavior. Per self inventory, pt rates depression at a 9, hopelessness 10 and anxiety 10. Pt's daily goal is to "just to get through the day" and they intend to do so by "get through today". Pt reports poor sleep, a fair appetite, low energy and poor concentration.   Pt provided with medications per providers orders. Pt's labs and vitals were monitored throughout the day. Pt supported emotionally and encouraged to express concerns and questions. Pt educated on medications.  Pt's safety ensured with 15 minute and environmental checks. Pt reports passive SI, states "I have a plan for what I would do in every room of my house, I wouldn't do anything while I was here." Pt currently denies HI and A/V hallucinations. Pt verbally agrees to seek staff if HI or A/VH occurs and to consult with staff before acting on any harmful thoughts. Pt states that her discharge plan will be to participate in ECT. Will continue POC.

## 2015-12-07 MED ORDER — HYDROXYZINE HCL 25 MG PO TABS
25.0000 mg | ORAL_TABLET | ORAL | Status: DC | PRN
  Administered 2015-12-07 – 2015-12-11 (×13): 25 mg via ORAL
  Filled 2015-12-07 (×14): qty 1

## 2015-12-07 MED ORDER — DULOXETINE HCL 60 MG PO CPEP
60.0000 mg | ORAL_CAPSULE | Freq: Two times a day (BID) | ORAL | Status: DC
  Administered 2015-12-08 – 2015-12-15 (×15): 60 mg via ORAL
  Filled 2015-12-07 (×17): qty 1

## 2015-12-07 NOTE — Progress Notes (Addendum)
Patient ID: Valerie Woodard, female   DOB: 1966/07/15, 50 y.o.   MRN: [id number] [facility] MD Progress Note  12/07/2015 6:05 PM [name]  MRN:  [id number] Subjective:     Patient reports some improvement , but continues to report depression and ongoing suicidal ruminations .  Denies medication side effects.   Objective: I have discussed case with treatment team and have met with patient. Behavior on unit in good control, visible on unit, sociable and focused on helping other patients . Remains sad, depressed,  but compared to admission, is less tearful, and presents with a fuller range of affect. She does, however, continue to endorse suicidal ideations , with thoughts of cutting her neck on broken glass when she returns home. As noted in prior notes, she has denied current suicidal plan or intent on unit and contracts for safety. She continues to ruminate about her deceased parents and poor social support system. She is expressing  interest in ECT as a treatment option. Initially apprehensive but today expressing hope that if possible, this treatment may help   Principal Problem: Major depressive disorder ([place]) Diagnosis:   Patient Active Problem List   Diagnosis Date Noted  . Severe single current episode of major depressive disorder, without psychotic features ([place]) [F32.2]   . Hypothyroidism [E03.9] 12/02/2015  . Major depressive disorder ([place]) [F32.9] 11/29/2015  . 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 Metabolic syndrome [X52.84] 04/27/2014  . Other and unspecified hyperlipidemia [E78.5] 04/27/2014  . DM (diabetes mellitus) ([place]) [E11.9] 04/27/2014  . Pancreatitis [K85.9] 04/23/2014  . Acute pancreatitis [K85.90] 04/23/2014  . Chest pain [R07.9] 04/23/2014  . Chronic pain syndrome [G89.4] 04/23/2014  . Hepatic steatosis [K76.0] 04/23/2014   Total Time spent with patient: 20 minutes     Past Medical History:  Past Medical History  Diagnosis Date  . Opioid use disorder, severe,  in sustained remission, on maintenance therapy 2013    on Methadone maintenance  . Vaginal delivery ~ 1993    dtr 50 yo as of 04/2014  . Chronic pain decades    mostly spinal  . Obesity 2015    230#, 104 kg, BMI 33 as of 04/2014  . DM (diabetes mellitus) ([place]) 04/27/2014    Past Surgical History  Procedure Laterality Date  . Knee arthroscopy Right ~ 1995    twice.   Family History: History reviewed. No pertinent family history.  Social History:  History  Alcohol Use No     History  Drug Use No    Social History   Social History  . Marital Status: Divorced    Spouse Name: N/A  . Number of Children: N/A  . Years of Education: N/A   Social History Main Topics  . Smoking status: Current Every Day Smoker -- 2.00 packs/day  . Smokeless tobacco: None  . Alcohol Use: No  . Drug Use: No  . Sexual Activity: Not Asked   Other Topics Concern  . None   Social History Narrative   Additional Social History:   Sleep: improved   Appetite:    Improved   Current Medications: Current Facility-Administered Medications  Medication Dose Route Frequency Provider Last Rate Last Dose  . acetaminophen (TYLENOL) tablet 650 mg  650 mg Oral Q6H PRN Lurena Nida, NP   650 mg at 12/06/15 1134  . alum & mag hydroxide-simeth (MAALOX/MYLANTA) 200-200-20 MG/5ML suspension 30 mL  30 mL Oral Q4H PRN Lurena Nida, NP      . ARIPiprazole (ABILIFY)  tablet 5 mg  5 mg Oral Daily Derrill Center, NP   5 mg at 12/07/15 0804  . DULoxetine (CYMBALTA) DR capsule 60 mg  60 mg Oral Daily Jenne Campus, MD   60 mg at 12/07/15 0804  . gabapentin (NEURONTIN) capsule 300 mg  300 mg Oral BID Jenne Campus, MD   300 mg at 12/07/15 1650  . hydrOXYzine (ATARAX/VISTARIL) tablet 25 mg  25 mg Oral Q4H PRN Jenne Campus, MD   25 mg at 12/07/15 1458  . levothyroxine (SYNTHROID, LEVOTHROID) tablet 50 mcg  50 mcg Oral Q breakfast Jenne Campus, MD   50 mcg at 12/07/15 0806  . magnesium hydroxide (MILK OF  MAGNESIA) suspension 30 mL  30 mL Oral Daily PRN Lurena Nida, NP      . nicotine (NICODERM CQ - dosed in mg/24 hours) patch 21 mg  21 mg Transdermal Daily Jenne Campus, MD   21 mg at 12/07/15 0804  . zolpidem (AMBIEN) tablet 5 mg  5 mg Oral QHS Jenne Campus, MD   5 mg at 12/06/15 2136    Lab Results:  No results found for this or any previous visit (from the past 48 hour(s)).  Physical Findings: AIMS: Facial and Oral Movements Muscles of Facial Expression: None, normal Lips and Perioral Area: None, normal Jaw: None, normal Tongue: None, normal,Extremity Movements Upper (arms, wrists, hands, fingers): None, normal Lower (legs, knees, ankles, toes): None, normal, Trunk Movements Neck, shoulders, hips: None, normal, Overall Severity Severity of abnormal movements (highest score from questions above): None, normal Incapacitation due to abnormal movements: None, normal Patient's awareness of abnormal movements (rate only patient's report): No Awareness, Dental Status Current problems with teeth and/or dentures?: No Does patient usually wear dentures?: No  CIWA:  CIWA-Ar Total: 1 COWS:  COWS Total Score: 1  Musculoskeletal: Strength & Muscle Tone: within normal limits Gait & Station: normal Patient leans: N/A  Psychiatric Specialty Exam: Review of Systems  Psychiatric/Behavioral: Positive for depression. Negative for suicidal ideas and hallucinations. The patient is nervous/anxious and has insomnia.   All other systems reviewed and are negative. denies chest pain, no shortness of breath , no vomiting , no dysuria or urgency  Blood pressure 133/90, pulse 105, temperature 99 F (37.2 C), temperature source Oral, resp. rate 16, height _0  (1.702 m), weight 203 lb (92.08 kg), SpO2 96 %.Body mass index is 31.79 kg/(m^2).  General Appearance: casual, pleasant   Eye Contact::  Good  Speech:  Normal Rate  Volume:  Normal  Mood:  Remains depressed   Affect:  Less constricted  than on admission, less tearful, more reactive affect    Thought Process:  Linear  Orientation:  Full (Time, Place, and Person)  Thought Content:  denies hallucinations, no delusions , not internally preoccupied   Suicidal Thoughts: passive thoughts of wanting to die. Ongoing suicidal thoughts of hurting self when she returns home after discharge .  Contracts for safety on unit   Homicidal Thoughts:  No  Memory:  recent and remote grossly intact   Judgement:  Improving   Insight:  Improving   Psychomotor Activity:  Normal  Concentration:  Good  Recall:  Good  Fund of Knowledge:Good  Language: Good  Akathisia:  Negative  Handed:  Right  AIMS (if indicated):     Assets:  Communication Skills Desire for Improvement Resilience  ADL's:  Intact  Cognition: WNL  Sleep:  Number of Hours: 6   Assessment -  at this time patient  Is somewhat improved compared to admission- she remains depressed , ruminative, and continues to have suicidal ruminations, although is able to contract for safety on unit. Her affect, however, is clearly improved compared to admission and no longer tearful, constricted, also, more interactive with peers.  Tolerating medications well thus far . Insomnia improved on Ambien, without side effects  Expressing some  interest in ECT if possible .  Treatment Plan Summary:  Daily contact with patient to assess and evaluate symptoms and progress in treatment, Medication management, Plan inpatient admission and medications as below    Encourage ongoing  milieu and group participation to work on coping skills and symptom reduction  Increase Cymbalta  To 60 mg BID  For ongoing  depression Continue   Abilify 5 mgs PO QDAY for mood stabilization as augmentation Continue  Neurontin  300  mgrs BID for management of anxiety and pain  Continue Ambien 5 mgrs QHS for insomnia  Continue Vistaril PRNs for anxiety as needed  Continue Synthroid, recently started for Hypothyroidism  .  Neita Garnet, MD 12/07/2015, 6:05 PM

## 2015-12-07 NOTE — Progress Notes (Signed)
D: Pt's affect brightens upon interaction. Pt presents with an anxious and depressed mood. Pt is passive for SI. Pt contracts for safety. "I won't do anything here". Pt reports having no support system, especially after the passing of her parents. Pt encouraged to speak with SW in regards to any local support roups. Pt observed interacting with her pees in the dayroom. A: Writer administered scheduled and prn medications to pt, per MD orders. Continued support and availability as needed was extended to this pt. Staff continues to monitor pt with q10min checks.  R: No adverse drug reactions noted. Pt receptive to treatment. Pt remains safe at this time.

## 2015-12-07 NOTE — [facility] Group Notes (Signed)
[facility] LCSW Aftercare Discharge Planning Group Note  12/07/2015 8:45 AM  Pt attended group but declined to participate, stating that she is "stressed out this morning."  Chad Cordial, LCSWA 12/07/2015 10:06 AM

## 2015-12-07 NOTE — Tx Team (Signed)
Interdisciplinary Treatment Plan Update (Adult) Date: 12/07/2015 **This is a late entry**  Date: 12/07/2015 8:40 AM  Progress in Treatment:  Attending groups: Yes Participating in groups: Yes, minimally Taking medication as prescribed: Yes  Tolerating medication: Yes  Family/Significant othe contact made: No, Pt declines Patient understands diagnosis: Yes AEB seeking help with depression Discussing patient identified problems/goals with staff: Yes  Medical problems stabilized or resolved: Yes  Denies suicidal/homicidal ideation: Yes Patient has not harmed self or Others: Yes   New problem(s) identified: None identified at this time.   Discharge Plan or Barriers: Pt will return home and follow-up with outpatient resources  Additional comments:  Patient and CSW reviewed pt's identified goals and treatment plan. Patient verbalized understanding and agreed to treatment plan. CSW reviewed [facility] "Discharge Process and Patient Involvement" Form. Pt verbalized understanding of information provided and signed form.   Reason for Continuation of Hospitalization:  Anxiety Depression Medication stabilization Suicidal ideation  Estimated length of stay: 3-5 days  Review of initial/current patient goals per problem list:   1.  Goal(s): Patient will participate in aftercare plan  Met:  Yes  Target date: 3-5 days from date of admission   As evidenced by: Patient will participate within aftercare plan AEB aftercare provider and housing plan at discharge being identified.   11/30/15: Pt will return home and follow-up with outpatient resources  2.  Goal (s): Patient will exhibit decreased depressive symptoms and suicidal ideations.  Met:  No  Target date: 3-5 days from date of admission   As evidenced by: Patient will utilize self rating of depression at 3 or below and demonstrate decreased signs of depression or be deemed stable for discharge by MD. 11/30/15: Pt was admitted with symptoms of  depression, rating 10/10. Pt continues to present with flat affect and depressive symptoms. 12/07/15: Pt continues to rate depression at 9/10 and endorses passive SI    3.  Goal(s): Patient will demonstrate decreased signs and symptoms of anxiety.  Met:  No  Target date: 3-5 days from date of admission   As evidenced by: Patient will utilize self rating of anxiety at 3 or below and demonstrated decreased signs of anxiety, or be deemed stable for discharge by MD 11/30/15: Pt was admitted with increased levels of anxiety and is currently rating those symptoms highly.  12/07/15: Pt continues to rate anxiety at 10/10.  Attendees:  Patient:    Family:    Physician: Dr. Parke Poisson, MD  12/07/2015 8:40 AM  Nursing: Lars Pinks, RN Case manager  12/07/2015 8:40 AM  Clinical Social Worker Peri Maris, [place] 12/07/2015 8:40 AM  Other: Tilden Fossa, LCSWA 12/07/2015 8:40 AM  Clinical: Kerby Nora, RN; Darrol Angel, RN; Idell Pickles, RN 12/07/2015 8:40 AM  Other: , RN Charge Nurse 12/07/2015 8:40 AM  Other:     Peri Maris, [place] Social Work [phone]

## 2015-12-07 NOTE — Progress Notes (Signed)
D- Patient has been complaining of increased anxiety.  Patient has required the use of prn medications this shift to help decrease anxiety.  Patient denies SI, HI and AVH. Patient has attended groups and interacted with peers.  Continue to assess for safety.   A-  Assess patient for safety, offer medications as prescribed, engage patient in 1:1 staff talks  R-  Patient able to contract for safety.  Continue to monitor.

## 2015-12-07 NOTE — [facility] Group Notes (Signed)
[facility] LCSW Group Therapy  12/07/2015 1:15pm  Type of Therapy:  Group Therapy vercoming Obstacles  Participation Level:  Minimal  Participation Quality:  Reserved  Affect:  Withdrawn but Improving  Cognitive:  Appropriate and Oriented  Insight:  Developing/Improving and Improving  Engagement in Therapy:  Improving  Modes of Intervention:  Discussion, Exploration, Problem-solving and Support  Description of Group:   In this group patients will be encouraged to explore what they see as obstacles to their own wellness and recovery. They will be guided to discuss their thoughts, feelings, and behaviors related to these obstacles. The group will process together ways to cope with barriers, with attention given to specific choices patients can make. Each patient will be challenged to identify changes they are motivated to make in order to overcome their obstacles. This group will be process-oriented, with patients participating in exploration of their own experiences as well as giving and receiving support and challenge from other group members.  Summary of Patient Progress: Pt participated minimally, however did identify feeling frustrated and hopeless as she is facing depression.   Therapeutic Modalities:   Cognitive Behavioral Therapy Solution Focused Therapy Motivational Interviewing Relapse Prevention Therapy   Chad Cordial, LCSWA 12/07/2015 3:13 PM

## 2015-12-08 NOTE — [facility] Group Notes (Signed)
[facility] LCSW Group Therapy 12/08/2015 1:15 PM  Type of Therapy: Group Therapy- Feelings about Diagnosis  Participation Level: Active   Participation Quality:  Appropriate  Affect:  Appropriate  Cognitive: Alert and Oriented   Insight:  Developing   Engagement in Therapy: Developing/Improving and Engaged   Modes of Intervention: Clarification, Confrontation, Discussion, Education, Exploration, Limit-setting, Orientation, Problem-solving, Rapport Building, Dance movement psychotherapist, Socialization and Support  Description of Group:   This group will allow patients to explore their thoughts and feelings about diagnoses they have received. Patients will be guided to explore their level of understanding and acceptance of these diagnoses. Facilitator will encourage patients to process their thoughts and feelings about the reactions of others to their diagnosis, and will guide patients in identifying ways to discuss their diagnosis with significant others in their lives. This group will be process-oriented, with patients participating in exploration of their own experiences as well as giving and receiving support and challenge from other group members.  Summary of Progress/Problems:  Pt expresses that she feels trapped in her home as her parents are passed away and she lives in their home. However, Pt was avoidant to processing the need to be involved in activities that take her away from the house more often. Pt was focused on getting her lawyer's number out of the cell phone in her locker.  Therapeutic Modalities:   Cognitive Behavioral Therapy Solution Focused Therapy Motivational Interviewing Relapse Prevention Therapy  Chad Cordial, LCSWA 12/08/2015 3:33 PM

## 2015-12-08 NOTE — Progress Notes (Signed)
Recreation Therapy Notes  Animal-Assisted Activity (AAA) Program Checklist/Progress Notes Patient Eligibility Criteria Checklist & Daily Group note for Rec Tx Intervention  Date: 02.14.2017 Time: 2:45pm Location: [address] Dayroom    AAA/T Program Assumption of Risk Form signed by Patient/ or Parent Legal Guardian yes  Patient is free of allergies or sever asthma yes  Patient reports no fear of animals yes  Patient reports no history of cruelty to animals yes  Patient understands his/her participation is voluntary yes  Patient washes hands before animal contact yes  Patient washes hands after animal contact yes  Behavioral Response: Appropriate  Education: Hand Washing, Appropriate Animal Interaction   Education Outcome: Acknowledges education.   Clinical Observations/Feedback: Patient pet therapy dog appropriately and interacted with peers appropriately during session.   Lashayla Armes L Jeramy Dimmick, LRT/CTRS  Tayen Narang L 12/08/2015 3:18 PM 

## 2015-12-08 NOTE — Progress Notes (Signed)
D: Patient continues to express passive SI.  She rates her depression as a 9; hopelessness and anxiety as a 10.  Plan is for her to receive ECT tx at [facility].  Patient states, "I was real excited about it.  Then I found out that I would have to go somewhere else.  That really got me down."  Patient has been attending groups and participates only when prompted to.  She has been interacting well with staff and peers.  She denies HI/AVH.  She continues to experience withdrawal symptoms such as agitation, runny nose, chilling, cramping, nausea and irritability.   A: Continue to monitor medication management and MD orders.  Safety checks completed every 15 minutes per protocol.  Offer support and encouragement as needed. R: Patient is receptive to staff; her behavior is appropriate.

## 2015-12-08 NOTE — Progress Notes (Signed)
Adult Psychoeducational Group Note  Date:  12/08/2015 Time:  10:40 PM  Group Topic/Focus:  Wrap-Up Group:   The focus of this group is to help patients review their daily goal of treatment and discuss progress on daily workbooks.  Participation Level:  Active  Participation Quality:  Appropriate  Affect:  Appropriate  Cognitive:  Alert  Insight: Appropriate  Engagement in Group:  Engaged  Modes of Intervention:  Discussion  Additional Comments:  Pt stated that she had an ok day. Her goal for today was to just get through the day. Her goal for tomorrow is to not be depressed.  Kaleen Odea R 12/08/2015, 10:40 PM

## 2015-12-08 NOTE — Progress Notes (Signed)
Pt attended spiritual care group on grief and loss facilitated by chaplain Burnis Kingfisher   Group opened with brief discussion and psycho-social ed around grief and loss in relationships and in relation to self - identifying life patterns, circumstances, changes that cause losses. Established group norm of speaking from own life experience. Group goal of establishing open and affirming space for members to share loss and experience with grief, normalize grief experience and provide psycho social education and grief support.    Valerie Woodard was present through most of group.  Was not verbally engaged and holding head.  Another group member noticed this and checked in with Toniann Fail.  Facilitator made space for Trystin to speak, who stated she had a headache.  She went on to say she did not know group was going to be about grief and loss and would have chosen not to come if she was aware.  Reported she was feeling as though speaking about grief was helpful.  Facilitator normalized this and several group members expressed feeling at other times that talking wasn't helpful.  Vessie went on to express that both her parents had died in a close time frame and her grief is complicated by family members taking her father's money, leaving Lumi to arrange cremation (which she would not have chosen), and family members telling her "you should be over this."  Group resonated with Cylinda's isolation in family telling her what she is feeling is not acceptable.  Tamela left group shortly thereafter and did not return.     Belva Crome MDiv

## 2015-12-08 NOTE — [facility] Group Notes (Signed)
[facility] Group Notes:  (Nursing/MHT/Case Management/Adjunct)  Date:  12/08/2015  Time:  0900 am  Type of Therapy:  Psychoeducational Skills  Participation Level:  Minimal  Participation Quality:  Attentive  Affect:  Anxious and Appropriate  Cognitive:  Appropriate  Insight:  Improving  Engagement in Group:  Lacking  Modes of Intervention:  Support  Summary of Progress/Problems: Self care was the theme of group.  Patient had to be prompted to participate.  She states "I like to exercise and play with my animals."  Once she started opening up, she shared that she has "sugar babies" which is a type of squirrel.  She explained to the group how they are great pets.  Patient become talkative once she started talking about her pets.  She seems to brighten up during this time.  [place]ranford Mon 12/08/2015, 10:47 AM

## 2015-12-08 NOTE — Progress Notes (Signed)
Patient ID: Valerie Woodard, female   DOB: 07-03-1966, 50 y.o.   MRN: [id number] [facility] MD Progress Note  12/08/2015 4:19 PM Valerie Woodard  MRN:  [id number] Subjective:      Patient continues to reports significant depression, sadness. Denies medication side effects. Expresses significant concern about " having to go to another hospital that is not as good or where people do not care " ( referring to possible transfer to another hospital for ECT ) .   Objective: I have discussed case with treatment team and have met with patient. Patient continues to present severely depressed, sad, tearful, and ruminative about stressors such as loneliness, death of mother.  She continues to have suicidal thoughts and makes statement of " wanting to be with my mother ". She has continued to have suicidal thoughts of killing self upon return home. She does contract for safety on unit . She is tolerating medications well, denies side effects, and expresses some increased sense  Of hope that medications will work. Cymbalta has been titrated to 60 mgrs BID, and is on Abilify as augmentation.  Tolerating Synthroid well thus far . Visible on unit, going to groups, interacting with selected peers. No disruptive or agitated behaviors . Fairly responsive to support, encouragement , empathy- affect improves partially during session.    Principal Problem: Major depressive disorder (HCC) Diagnosis:   Patient Active Problem List   Diagnosis Date Noted  . Severe single current episode of major depressive disorder, without psychotic features (HCC) [F32.2]   . Hypothyroidism [E03.9] 12/02/2015  . Major depressive disorder (HCC) [F32.9] 11/29/2015  . 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 Metabolic syndrome [E88.81] 04/27/2014  . Other and unspecified hyperlipidemia [E78.5] 04/27/2014  . DM (diabetes mellitus) (HCC) [E11.9] 04/27/2014  . Pancreatitis [K85.9] 04/23/2014  . Acute pancreatitis [K85.90] 04/23/2014  . Chest pain  [R07.9] 04/23/2014  . Chronic pain syndrome [G89.4] 04/23/2014  . Hepatic steatosis [K76.0] 04/23/2014   Total Time spent with patient: 20 minutes     Past Medical History:  Past Medical History  Diagnosis Date  . Opioid use disorder, severe, in sustained remission, on maintenance therapy 2013    on Methadone maintenance  . Vaginal delivery ~ 1993    dtr 50 yo as of 04/2014  . Chronic pain decades    mostly spinal  . Obesity 2015    230#, 104 kg, BMI 33 as of 04/2014  . DM (diabetes mellitus) (HCC) 04/27/2014    Past Surgical History  Procedure Laterality Date  . Knee arthroscopy Right ~ 1995    twice.   Family History: History reviewed. No pertinent family history.  Social History:  History  Alcohol Use No     History  Drug Use No    Social History   Social History  . Marital Status: Divorced    Spouse Name: N/A  . Number of Children: N/A  . Years of Education: N/A   Social History Main Topics  . Smoking status: Current Every Day Smoker -- 2.00 packs/day  . Smokeless tobacco: None  . Alcohol Use: No  . Drug Use: No  . Sexual Activity: Not Asked   Other Topics Concern  . None   Social History Narrative   Additional Social History:   Sleep: improved   Appetite:    Improved   Current Medications: Current Facility-Administered Medications  Medication Dose Route Frequency Provider Last Rate Last Dose  . acetaminophen (TYLENOL) tablet 650 mg  650 mg Oral Q6H PRN  Lurena Nida, NP   650 mg at 12/06/15 1134  . alum & mag hydroxide-simeth (MAALOX/MYLANTA) 200-200-20 MG/5ML suspension 30 mL  30 mL Oral Q4H PRN Lurena Nida, NP      . ARIPiprazole (ABILIFY) tablet 5 mg  5 mg Oral Daily Derrill Center, NP   5 mg at 12/08/15 0804  . DULoxetine (CYMBALTA) DR capsule 60 mg  60 mg Oral BID Jenne Campus, MD   60 mg at 12/08/15 0803  . gabapentin (NEURONTIN) capsule 300 mg  300 mg Oral BID Jenne Campus, MD   300 mg at 12/08/15 0803  . hydrOXYzine  (ATARAX/VISTARIL) tablet 25 mg  25 mg Oral Q4H PRN Jenne Campus, MD   25 mg at 12/08/15 1106  . levothyroxine (SYNTHROID, LEVOTHROID) tablet 50 mcg  50 mcg Oral Q breakfast Jenne Campus, MD   50 mcg at 12/08/15 0804  . magnesium hydroxide (MILK OF MAGNESIA) suspension 30 mL  30 mL Oral Daily PRN Lurena Nida, NP      . nicotine (NICODERM CQ - dosed in mg/24 hours) patch 21 mg  21 mg Transdermal Daily Jenne Campus, MD   21 mg at 12/08/15 8338  . zolpidem (AMBIEN) tablet 5 mg  5 mg Oral QHS Jenne Campus, MD   5 mg at 12/07/15 2225    Lab Results:  No results found for this or any previous visit (from the past 60 hour(s)).  Physical Findings: AIMS: Facial and Oral Movements Muscles of Facial Expression: None, normal Lips and Perioral Area: None, normal Jaw: None, normal Tongue: None, normal,Extremity Movements Upper (arms, wrists, hands, fingers): None, normal Lower (legs, knees, ankles, toes): None, normal, Trunk Movements Neck, shoulders, hips: None, normal, Overall Severity Severity of abnormal movements (highest score from questions above): None, normal Incapacitation due to abnormal movements: None, normal Patient's awareness of abnormal movements (rate only patient's report): No Awareness, Dental Status Current problems with teeth and/or dentures?: No Does patient usually wear dentures?: No  CIWA:  CIWA-Ar Total: 1 COWS:  COWS Total Score: 1  Musculoskeletal: Strength & Muscle Tone: within normal limits Gait & Station: normal Patient leans: N/A  Psychiatric Specialty Exam: Review of Systems  Psychiatric/Behavioral: Positive for depression. Negative for suicidal ideas and hallucinations. The patient is nervous/anxious and has insomnia.   All other systems reviewed and are negative. denies chest pain, no shortness of breath , no vomiting , no dysuria or urgency  Blood pressure 134/77, pulse 98, temperature 98.4 F (36.9 C), temperature source Oral, resp. rate  18, height '5\' 7"'$  (1.702 m), weight 203 lb (92.08 kg), SpO2 96 %.Body mass index is 31.79 kg/(m^2).  General Appearance: casual, pleasant   Eye Contact::  Good  Speech:  Normal Rate  Volume:  Normal  Mood:  Depressed   Affect:  Constricted, and intermittently tearful.  Thought Process:  Linear  Orientation:  Full (Time, Place, and Person)  Thought Content:  denies hallucinations, no delusions , not internally preoccupied   Suicidal Thoughts: continues to have suicidal ideations on returning home- on unit denies suicidal plan or intention and contracts for safety here   Homicidal Thoughts:  No  Memory:  recent and remote grossly intact   Judgement:   Fair   Insight:  Improving   Psychomotor Activity:  Normal  Concentration:  Good  Recall:  Good  Fund of Knowledge:Good  Language: Good  Akathisia:  Negative  Handed:  Right  AIMS (if indicated):  Assets:  Communication Skills Desire for Improvement Resilience  ADL's:  Intact  Cognition: WNL  Sleep:  Number of Hours: 4.5   Assessment - Patient remains significantly depressed, sad, ruminative, and continues to have suicidal ideations . Limited support network, living alone , death of mother, emotional distancing from adult daughter  are contributing factors . Passive SI, but contracts for safety on unit, no psychotic symptoms . Today is expressing some increased sense of optimism that antidepressants will help. Has tolerating medications well thus far .  Treatment Plan Summary:  Daily contact with patient to assess and evaluate symptoms and progress in treatment, Medication management, Plan inpatient admission and medications as below    Encourage ongoing  milieu and group participation to work on coping skills and symptom reduction  Continue Cymbalta   60 mg BID  For ongoing  depression Continue   Abilify 5 mgs PO QDAY for mood stabilization as augmentation Continue  Neurontin  300  mgrs BID for management of anxiety and pain   Continue Ambien 5 mgrs QHS for insomnia  Continue Vistaril PRNs for anxiety as needed  Continue Synthroid, recently started for Hypothyroidism . As discussed with CSW- patient may benefit from inpatient ECT and referral has been sent- patient agreeing to ECT , but expressing concern about transferring to another unit, as feels more comfortable and supported here.  Neita Garnet, MD 12/08/2015, 4:19 PM

## 2015-12-09 NOTE — [facility] Group Notes (Signed)
[facility] LCSW Aftercare Discharge Planning Group Note  12/09/2015 8:45 AM  Participation Quality: Alert, Appropriate and Oriented  Mood/Affect: Fluctuating- Laughing but then Flat  Depression Rating: 9  Anxiety Rating: 9  Thoughts of Suicide: Pt denies SI/HI  Will you contract for safety? Yes  Current AVH: Pt denies  Plan for Discharge/Comments: Pt attended discharge planning group and actively participated in group. CSW discussed suicide prevention education with the group and encouraged them to discuss discharge planning and any relevant barriers. Pt observed to be snickering with another peer and then when addressed by CSW, affect became flat and was resistant to share.  Transportation Means: Pt reports access to transportation  Supports: No supports mentioned at this time  Chad Cordial, LCSWA 12/09/2015 9:28 AM

## 2015-12-09 NOTE — Progress Notes (Signed)
Recreation Therapy Notes  Date: 02.17.2017 Time: 9:30am Location: [address] Group Room   Group Topic: Stress Management  Goal Area(s) Addresses:  Patient will actively participate in stress management techniques presented during session.   Behavioral Response: Did not attend.    Moranda Billiot L Serin Thornell, LRT/CTRS        Lisbet Busker L 12/09/2015 2:08 PM 

## 2015-12-09 NOTE — Progress Notes (Signed)
Adult Psychoeducational Group Note  Date:  12/09/2015 Time:  9:41 PM  Group Topic/Focus:  Wrap-Up Group:   The focus of this group is to help patients review their daily goal of treatment and discuss progress on daily workbooks.  Participation Level:  Minimal  Participation Quality:  Appropriate  Affect:  Appropriate  Cognitive:  Alert  Insight: Appropriate  Engagement in Group:  Engaged  Modes of Intervention:  Discussion  Additional Comments:  Pt stated that she had an ok day. Her goal for tomorrow is to get through the day. One positive coping skill is has is to play with animals.   Kaleen Odea R 12/09/2015, 9:41 PM

## 2015-12-09 NOTE — Progress Notes (Signed)
Pt reports she had rough day and still feels very depressed and anxious.  She says that she is not ready for discharge. She contracts for safety on the unit, but says she would not feel safe discharging home.  She is tolerating her medication well.  She has been requesting her Vistaril every four hours.  She denies HI/AVH.  Pt has been observed in the dayroom most of the evening talking with one of her peers.  She makes her needs known to staff.  Support and encouragement offered.  Discharge plans are in process.  Safety maintained with q15 minute checks.

## 2015-12-09 NOTE — [facility] Group Notes (Signed)
[facility] LCSW Group Therapy 12/09/2015 1:15 PM  Type of Therapy: Group Therapy- Emotion Regulation  Participation Level: Minimal  Participation Quality:  Reserved  Affect: Flat  Cognitive: Alert and Oriented   Insight:  Developing/Improving  Engagement in Therapy: Limited  Modes of Intervention: Clarification, Confrontation, Discussion, Education, Exploration, Limit-setting, Orientation, Problem-solving, Rapport Building, Dance movement psychotherapist, Socialization and Support  Summary of Progress/Problems: The topic for group today was emotional regulation. This group focused on both positive and negative emotion identification and allowed group members to process ways to identify feelings, regulate negative emotions, and find healthy ways to manage internal/external emotions. Group members were asked to reflect on a time when their reaction to an emotion led to a negative outcome and explored how alternative responses using emotion regulation would have benefited them. Group members were also asked to discuss a time when emotion regulation was utilized when a negative emotion was experienced. Pt was attentive to group discussion but does not participate verbally. Continues to be withdrawn in the group sessions.   Chad Cordial, LCSWA 12/09/2015 4:17 PM

## 2015-12-09 NOTE — Progress Notes (Signed)
Patient ID: Valerie Woodard, female   DOB: [birth year], 50 y.o.   MRN: [id number]  DAR: Pt. Denies HI and A/V Hallucinations. She reports passive SI but is able to contract for safety. She reports sleep is fair, appetite is fair, energy level is low, and concentration is poor. She rates depression 9/10, anxiety 10/10, and hopelessness 10/10. Patient does report generalized pain but refuses PRN intervention at this time. Support and encouragement provided to the patient. Scheduled medications administered to patient per physician's orders. Patient is observed without her knowledge in the milieu interacting with her peers laughing however comes to staff after dinner stating that she misses her mom and having SI. Patient tearful when speaking. Q15 minute checks are maintained for safety.

## 2015-12-09 NOTE — Progress Notes (Signed)
Patient ID: Valerie Woodard, female   DOB: 14-Dec-1965, 50 y.o.   MRN: [id number] [facility] MD Progress Note  12/09/2015 2:13 PM Valerie Woodard  MRN:  [id number] Subjective:     Patient reports some improvement but continues to endorse depression. At this time denies medication side effects.  Although improved,she continues to report signigicant anxiety, particularly regarding returning to her psychosocial stressors on discharge .   Objective: I have discussed case with treatment team and have met with patient. Patient endorses ongoing depression  but does acknowledge partial improvement compared to admission. She has had ongoing SI, but at this time denies plan or intention, and seems more future oriented .  As discussed with staff, has presented with improvement- has been noted to be more interactive in groups, smiling and laughing during interactions with peers . She is more future oriented, and spoke about selling her house and being able to complete pending lawsuit on behalf of her mother, after which she would " downsize " to a smaller home .  Visible on unit, going to groups. No disruptive or agitated behaviors . Tolerating medications well, has tolerated increased Cymbalta dose well , no nausea, no vomiting .     Principal Problem: Major depressive disorder ([place]) Diagnosis:   Patient Active Problem List   Diagnosis Date Noted  . Severe single current episode of major depressive disorder, without psychotic features ([place]) [F32.2]   . Hypothyroidism [E03.9] 12/02/2015  . Major depressive disorder ([place]) [F32.9] 11/29/2015  . 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 Metabolic syndrome [H47.42] 04/27/2014  . Other and unspecified hyperlipidemia [E78.5] 04/27/2014  . DM (diabetes mellitus) ([place]) [E11.9] 04/27/2014  . Pancreatitis [K85.9] 04/23/2014  . Acute pancreatitis [K85.90] 04/23/2014  . Chest pain [R07.9] 04/23/2014  . Chronic pain syndrome [G89.4] 04/23/2014  . Hepatic steatosis [K76.0]  04/23/2014   Total Time spent with patient: 20 minutes     Past Medical History:  Past Medical History  Diagnosis Date  . Opioid use disorder, severe, in sustained remission, on maintenance therapy 2013    on Methadone maintenance  . Vaginal delivery ~ 1993    dtr 50 yo as of 04/2014  . Chronic pain decades    mostly spinal  . Obesity 2015    230#, 104 kg, BMI 33 as of 04/2014  . DM (diabetes mellitus) ([place]) 04/27/2014    Past Surgical History  Procedure Laterality Date  . Knee arthroscopy Right ~ 1995    twice.   Family History: History reviewed. No pertinent family history.  Social History:  History  Alcohol Use No     History  Drug Use No    Social History   Social History  . Marital Status: Divorced    Spouse Name: N/A  . Number of Children: N/A  . Years of Education: N/A   Social History Main Topics  . Smoking status: Current Every Day Smoker -- 2.00 packs/day  . Smokeless tobacco: None  . Alcohol Use: No  . Drug Use: No  . Sexual Activity: Not Asked   Other Topics Concern  . None   Social History Narrative   Additional Social History:   Sleep: improved   Appetite:    Improved   Current Medications: Current Facility-Administered Medications  Medication Dose Route Frequency Provider Last Rate Last Dose  . acetaminophen (TYLENOL) tablet 650 mg  650 mg Oral Q6H PRN Lurena Nida, NP   650 mg at 12/06/15 1134  . alum & mag hydroxide-simeth (MAALOX/MYLANTA)  200-200-20 MG/5ML suspension 30 mL  30 mL Oral Q4H PRN Lurena Nida, NP      . ARIPiprazole (ABILIFY) tablet 5 mg  5 mg Oral Daily Derrill Center, NP   5 mg at 12/09/15 0755  . DULoxetine (CYMBALTA) DR capsule 60 mg  60 mg Oral BID Jenne Campus, MD   60 mg at 12/09/15 0755  . gabapentin (NEURONTIN) capsule 300 mg  300 mg Oral BID Jenne Campus, MD   300 mg at 12/09/15 0755  . hydrOXYzine (ATARAX/VISTARIL) tablet 25 mg  25 mg Oral Q4H PRN Jenne Campus, MD   25 mg at 12/09/15 1257  .  levothyroxine (SYNTHROID, LEVOTHROID) tablet 50 mcg  50 mcg Oral Q breakfast Jenne Campus, MD   50 mcg at 12/09/15 0755  . magnesium hydroxide (MILK OF MAGNESIA) suspension 30 mL  30 mL Oral Daily PRN Lurena Nida, NP      . nicotine (NICODERM CQ - dosed in mg/24 hours) patch 21 mg  21 mg Transdermal Daily Jenne Campus, MD   21 mg at 12/09/15 0755  . zolpidem (AMBIEN) tablet 5 mg  5 mg Oral QHS Jenne Campus, MD   5 mg at 12/08/15 2107    Lab Results:  No results found for this or any previous visit (from the past 35 hour(s)).  Physical Findings: AIMS: Facial and Oral Movements Muscles of Facial Expression: None, normal Lips and Perioral Area: None, normal Jaw: None, normal Tongue: None, normal,Extremity Movements Upper (arms, wrists, hands, fingers): None, normal Lower (legs, knees, ankles, toes): None, normal, Trunk Movements Neck, shoulders, hips: None, normal, Overall Severity Severity of abnormal movements (highest score from questions above): None, normal Incapacitation due to abnormal movements: None, normal Patient's awareness of abnormal movements (rate only patient's report): No Awareness, Dental Status Current problems with teeth and/or dentures?: No Does patient usually wear dentures?: No  CIWA:  CIWA-Ar Total: 1 COWS:  COWS Total Score: 1  Musculoskeletal: Strength & Muscle Tone: within normal limits Gait & Station: normal Patient leans: N/A  Psychiatric Specialty Exam: Review of Systems  Psychiatric/Behavioral: Positive for depression. Negative for suicidal ideas and hallucinations. The patient is nervous/anxious and has insomnia.   All other systems reviewed and are negative. denies chest pain, no shortness of breath , no vomiting , no dysuria or urgency  Blood pressure 135/74, pulse 88, temperature 98.1 F (36.7 C), temperature source Oral, resp. rate 18, height _0  (1.702 m), weight 203 lb (92.08 kg), SpO2 96 %.Body mass index is 31.79 kg/(m^2).   General Appearance: casual, pleasant   Eye Contact::  Good  Speech:  Normal Rate  Volume:  Normal  Mood:  Depressed , but improved compared to prior   Affect:  Constricted, but more reactive, smiles at times appropriately, not tearful today  Thought Process:  Linear  Orientation:  Full (Time, Place, and Person)  Thought Content:  denies hallucinations, no delusions , not internally preoccupied   Suicidal Thoughts: today does not endorse active SI, and denies any current plan or intention of hurting self on unit, able to contract for safety on unit.   Homicidal Thoughts:  No  Memory:  recent and remote grossly intact   Judgement:   Fair   Insight:  Improving   Psychomotor Activity:  Normal  Concentration:  Good  Recall:  Good  Fund of Knowledge:Good  Language: Good  Akathisia:  Negative  Handed:  Right  AIMS (if indicated):  Assets:  Communication Skills Desire for Improvement Resilience  ADL's:  Intact  Cognition: WNL  Sleep:  Number of Hours: 6   Assessment - Patient remains depressed, anxious, ruminative about stressors, but there is significant improvement compared to prior presentation- she is noted to have a more reactive affect, smiling/laughing appropriately in day room when interacting with peers, she is more future oriented, speaking of plans to sell her current house and move to another home, and less focused on suicidal ideations, today not endorsing SI. Tolerating medications well thus far .   Treatment Plan Summary:  Daily contact with patient to assess and evaluate symptoms and progress in treatment, Medication management, Plan inpatient admission and medications as below    Encourage ongoing  milieu and group participation to work on coping skills and symptom reduction  Continue Cymbalta   60 mg BID  For ongoing  depression Continue   Abilify 5 mgs PO QDAY for mood stabilization as augmentation Continue  Neurontin  300  mgrs BID for management of anxiety and  pain  Continue Ambien 5 mgrs QHS for insomnia  Continue Vistaril PRNs for anxiety as needed  Continue Synthroid, recently started for Hypothyroidism .  Neita Garnet, MD 12/09/2015, 2:13 PM

## 2015-12-10 NOTE — Progress Notes (Signed)
D: Pt presents anxious in affect and depressed in mood. Pt continues to be passive for SI. Pt verbally contracts for safety. Pt was less interactive with her peers this evening. Pt complained of throat pain. Airway patent. Pt received some relief with tylenol and fluids.  A: Writer administered scheduled and prn medications to pt, per MD orders. Continued support and availability as needed was extended to this pt. Staff continues to monitor pt with q70min checks.  R: No adverse drug reactions noted. Pt receptive to treatment. Pt remains safe at this time.

## 2015-12-10 NOTE — Progress Notes (Signed)
Patient ID: Valerie Woodard, female   DOB: [birth year], 50 y.o.   MRN: [id number] [facility] MD Progress Note  12/10/2015 2:06 PM Valerie Woodard  MRN:  [id number] Subjective:      Patient continues to report significant depression, and today tearful during session, particularly while discussing her being distant from her adult daughter. States they had a recent phone conversation and that daughter said she hoped patient would die.  Patient denies medication side effects. Objective: I have discussed case with treatment team and have met with patient. As noted, patient has presented with some improvement insofar as being more sociable and interactive with peers . Noted to smile and laugh during these interactions. She also has been expressing future oriented ideations, regarding selling her home and relocating. However, she continues to present sad, depressed, tearful during our session. As above, reports disturbing phone conversation with her adult daughter contributing to her feeling worse today . She has continued to express suicidal ideations, and states she misses her deceased mother and wants to join her in death. She contracts for safety on unit, but states she thinks she would " probably  end up killing myself " once she returns home . Denies medication side effects and does feel medication may be helping partially .  Visible on unit, going to groups. No disruptive or agitated behaviors .     Principal Problem: Major depressive disorder ([place]) Diagnosis:   Patient Active Problem List   Diagnosis Date Noted  . Severe single current episode of major depressive disorder, without psychotic features ([place]) [F32.2]   . Hypothyroidism [E03.9] 12/02/2015  . Major depressive disorder ([place]) [F32.9] 11/29/2015  . 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 Metabolic syndrome [W58.09] 04/27/2014  . Other and unspecified hyperlipidemia [E78.5] 04/27/2014  . DM (diabetes mellitus) ([place]) [E11.9] 04/27/2014  .  Pancreatitis [K85.9] 04/23/2014  . Acute pancreatitis [K85.90] 04/23/2014  . Chest pain [R07.9] 04/23/2014  . Chronic pain syndrome [G89.4] 04/23/2014  . Hepatic steatosis [K76.0] 04/23/2014   Total Time spent with patient: 20 minutes     Past Medical History:  Past Medical History  Diagnosis Date  . Opioid use disorder, severe, in sustained remission, on maintenance therapy 2013    on Methadone maintenance  . Vaginal delivery ~ 1993    dtr 50 yo as of 04/2014  . Chronic pain decades    mostly spinal  . Obesity 2015    230#, 104 kg, BMI 33 as of 04/2014  . DM (diabetes mellitus) ([place]) 04/27/2014    Past Surgical History  Procedure Laterality Date  . Knee arthroscopy Right ~ 1995    twice.   Family History: History reviewed. No pertinent family history.  Social History:  History  Alcohol Use No     History  Drug Use No    Social History   Social History  . Marital Status: Divorced    Spouse Name: N/A  . Number of Children: N/A  . Years of Education: N/A   Social History Main Topics  . Smoking status: Current Every Day Smoker -- 2.00 packs/day  . Smokeless tobacco: None  . Alcohol Use: No  . Drug Use: No  . Sexual Activity: Not Asked   Other Topics Concern  . None   Social History Narrative   Additional Social History:   Sleep: improved   Appetite:    Improved   Current Medications: Current Facility-Administered Medications  Medication Dose Route Frequency Provider Last Rate Last Dose  . acetaminophen (TYLENOL)  tablet 650 mg  650 mg Oral Q6H PRN Lurena Nida, NP   650 mg at 12/06/15 1134  . alum & mag hydroxide-simeth (MAALOX/MYLANTA) 200-200-20 MG/5ML suspension 30 mL  30 mL Oral Q4H PRN Lurena Nida, NP      . ARIPiprazole (ABILIFY) tablet 5 mg  5 mg Oral Daily Derrill Center, NP   5 mg at 12/10/15 0758  . DULoxetine (CYMBALTA) DR capsule 60 mg  60 mg Oral BID Jenne Campus, MD   60 mg at 12/10/15 0758  . gabapentin (NEURONTIN) capsule 300 mg   300 mg Oral BID Jenne Campus, MD   300 mg at 12/10/15 0758  . hydrOXYzine (ATARAX/VISTARIL) tablet 25 mg  25 mg Oral Q4H PRN Jenne Campus, MD   25 mg at 12/10/15 1314  . levothyroxine (SYNTHROID, LEVOTHROID) tablet 50 mcg  50 mcg Oral Q breakfast Jenne Campus, MD   50 mcg at 12/10/15 0758  . magnesium hydroxide (MILK OF MAGNESIA) suspension 30 mL  30 mL Oral Daily PRN Lurena Nida, NP      . nicotine (NICODERM CQ - dosed in mg/24 hours) patch 21 mg  21 mg Transdermal Daily Jenne Campus, MD   21 mg at 12/10/15 0758  . zolpidem (AMBIEN) tablet 5 mg  5 mg Oral QHS Jenne Campus, MD   5 mg at 12/09/15 2135    Lab Results:  No results found for this or any previous visit (from the past 40 hour(s)).  Physical Findings: AIMS: Facial and Oral Movements Muscles of Facial Expression: None, normal Lips and Perioral Area: None, normal Jaw: None, normal Tongue: None, normal,Extremity Movements Upper (arms, wrists, hands, fingers): None, normal Lower (legs, knees, ankles, toes): None, normal, Trunk Movements Neck, shoulders, hips: None, normal, Overall Severity Severity of abnormal movements (highest score from questions above): None, normal Incapacitation due to abnormal movements: None, normal Patient's awareness of abnormal movements (rate only patient's report): No Awareness, Dental Status Current problems with teeth and/or dentures?: No Does patient usually wear dentures?: No  CIWA:  CIWA-Ar Total: 1 COWS:  COWS Total Score: 1  Musculoskeletal: Strength & Muscle Tone: within normal limits Gait & Station: normal Patient leans: N/A  Psychiatric Specialty Exam: Review of Systems  Psychiatric/Behavioral: Positive for depression. Negative for suicidal ideas and hallucinations. The patient is nervous/anxious and has insomnia.   All other systems reviewed and are negative. denies chest pain, no shortness of breath , no vomiting , no dysuria or urgency  Blood pressure 132/76,  pulse 89, temperature 99.3 F (37.4 C), temperature source Oral, resp. rate 18, height '5\' 7"'$  (1.702 m), weight 203 lb (92.08 kg), SpO2 96 %.Body mass index is 31.79 kg/(m^2).  General Appearance: casual  Eye Contact::  Good  Speech:  Normal Rate  Volume:  Normal  Mood:  Remains depressed   Affect:  Constricted, tearful at times, at others more reactive , as when interacting with peers   Thought Process:  Linear  Orientation:  Full (Time, Place, and Person)  Thought Content:  denies hallucinations, no delusions , not internally preoccupied   Suicidal Thoughts: reports ongoing suicidal ideations, denies plan or intention of hurting self or of SI on unit, contracts for safety on unit   Homicidal Thoughts:  No  Memory:  recent and remote grossly intact   Judgement:   Fair   Insight:  Improving   Psychomotor Activity:  Normal  Concentration:  Good  Recall:  Good  Fund of Knowledge:Good  Language: Good  Akathisia:  Negative  Handed:  Right  AIMS (if indicated):     Assets:  Communication Skills Desire for Improvement Resilience  ADL's:  Intact  Cognition: WNL  Sleep:  Number of Hours: 6   Assessment -  Patient presents with ongoing affective lability- affect has improved in range and is more reactive, but reports ongoing depression, sadness, and today ruminates about poor relationship with daughter. Tolerating medications well, denies side effects.  Continues to report suicidal ideations after discharge, but able to contract for safety on unit.   Treatment Plan Summary:  Daily contact with patient to assess and evaluate symptoms and progress in treatment, Medication management, Plan inpatient admission and medications as below    Encourage ongoing  milieu and group participation to work on coping skills and symptom reduction  Continue Cymbalta   60 mg BID  For ongoing  depression Continue   Abilify 5 mgs PO QDAY for mood stabilization as augmentation Continue  Neurontin  300  mgrs  BID for management of anxiety and pain  Continue Ambien 5 mgrs QHS for insomnia  Continue Vistaril PRNs for anxiety as needed  Continue Synthroid, recently started for Hypothyroidism . As reviewed with staff, CSW, patient currently on  [facility] waiting list .  Neita Garnet, MD 12/10/2015, 2:06 PM

## 2015-12-10 NOTE — Progress Notes (Signed)
Nutrition Education Note  Pt attended group focusing on general, healthful nutrition education.  RD emphasized the importance of eating regular meals and snacks throughout the day. Consuming sugar-free beverages and incorporating fruits and vegetables into diet when possible. Provided examples of healthy snacks. Patient encouraged to leave group with a goal to improve nutrition/healthy eating.   Diet Order: Diet regular Room service appropriate?: Yes; Fluid consistency:: Thin Pt is also offered choice of unit snacks mid-morning and mid-afternoon.  Pt is eating as desired.   If additional nutrition issues arise, please consult RD.  Kristofor Michalowski, MS, RD, LDN Pager: [phone] After Hours Pager: [phone]     

## 2015-12-10 NOTE — Progress Notes (Signed)
D: Pt presents with flat affect and anxious mood. Pt rates depression 10/10. Anxiety 10/10. Hopeless 10/10. Pt endorses passive suicidal thoughts with a plan to stab herself with a pencil. Pt verbally contracts for safety. Pt reports poor sleep last night. Pt reported feeling anxious this morning. Pt stated that she doesn't feel like surrounding herself around others today. Pt compliant with taking meds. No adverse reactions to meds verbalized by pt.  A: Medications reviewed with pt. Medications administered as ordered per MD. Verbal support provided. Pt encouraged to attend groups. Pt encouraged to use coping skills during panic attacks. 15 minute checks performed for safety. R: Pt verbalized understanding of med regimen. Pt stated goal "not thinking about hurting myself". Pt receptive to tx.

## 2015-12-10 NOTE — Progress Notes (Signed)
D: Pt presents flat in affect and depressed in mood. Pt reports having thoughts of SI this afternoon. "I had to talk to one of the nurses".  Writer informed pt that I was aware of her plan and removed a pencil that I spotted in her room. Pt verbally contracts for safety with Clinical research associate.  Pt participated in group but with minimal interaction, per group facilitator.  A: Writer administered scheduled medications to pt, per MD orders. Continued support and availability as needed was extended to this pt. Staff continues to monitor pt with q68min checks.  R: No adverse drug reactions noted. Pt receptive to treatment. Pt remains safe at this time.

## 2015-12-10 NOTE — [facility] Group Notes (Signed)
[facility] Group Therapy 12/10/2015 1:15pm  Type of Therapy: Mental [employer] Presentation  Participation Level: Active  Participation Quality: Attentive  Affect: Appropriate  Cognitive: Oriented  Insight: Developing/Improving  Engagement in Therapy: Engaged  Modes of Intervention: Discussion, Education and Socialization  Summary of Progress/Problems: Mental Health Association (MHA) Speaker came to talk about his personal journey with substance abuse and addiction. The pt processed ways by which to relate to the speaker. MHA speaker provided handouts and educational information pertaining to groups and services offered by the MHA. Pt was engaged in speaker's presentation and was receptive to resources provided.    Arnav Cregg Carter, LCSWA 12/10/2015 1:43 PM  

## 2015-12-10 NOTE — Progress Notes (Signed)
Adult Psychoeducational Group Note  Date:  12/10/2015 Time:  0900  Group Topic/Focus:  Orientation:   The focus of this group is to educate the patient on the purpose and policies of crisis stabilization and provide a format to answer questions about their admission.  The group details unit policies and expectations of patients while admitted.  Participation Level:  Did Not Attend  Participation Quality:    Affect:    Cognitive:    Insight:   Engagement in Group:    Modes of Intervention:    Additional Comments:    [name] L 12/10/2015, 5:49 PM

## 2015-12-11 MED ORDER — LORAZEPAM 0.5 MG PO TABS
0.5000 mg | ORAL_TABLET | Freq: Four times a day (QID) | ORAL | Status: DC | PRN
  Administered 2015-12-11 – 2015-12-15 (×11): 0.5 mg via ORAL
  Filled 2015-12-11 (×11): qty 1

## 2015-12-11 NOTE — Progress Notes (Signed)
Recreation Therapy Notes   Date: 02.17.2017 Time: 9:30am Location: [address] Group Room   Group Topic: Stress Management  Goal Area(s) Addresses:  Patient will actively participate in stress management techniques presented during session.   Behavioral Response: Did not attend.   Teia Freitas L Shun Pletz, LRT/CTRS         Abbie Berling L 12/11/2015 3:00 PM 

## 2015-12-11 NOTE — [facility] Group Notes (Signed)
[facility] LCSW Aftercare Discharge Planning Group Note  12/11/2015 8:45 AM  Pt did not attend, declined invitation.   Chad Cordial, LCSWA 12/11/2015 9:30 AM

## 2015-12-11 NOTE — Tx Team (Signed)
Interdisciplinary Treatment Plan Update (Adult) Date: 12/11/2015  Date: 12/11/2015 10:05 AM  Progress in Treatment:  Attending groups: Yes Participating in groups: Yes, minimally Taking medication as prescribed: Yes  Tolerating medication: Yes  Family/Significant othe contact made: No, Pt declines Patient understands diagnosis: Yes AEB seeking help with depression Discussing patient identified problems/goals with staff: Yes  Medical problems stabilized or resolved: Yes  Denies suicidal/homicidal ideation: Pt endorses SI at times, denies it at others Patient has not harmed self or Others: Yes   New problem(s) identified: None identified at this time.   Discharge Plan or Barriers: Pt will return home and follow-up with outpatient resources  Additional comments:  Patient and CSW reviewed pt's identified goals and treatment plan. Patient verbalized understanding and agreed to treatment plan. CSW reviewed [facility] "Discharge Process and Patient Involvement" Form. Pt verbalized understanding of information provided and signed form.   Reason for Continuation of Hospitalization:  Anxiety Depression Medication stabilization Suicidal ideation  Estimated length of stay: 1-3 days  Review of initial/current patient goals per problem list:   1.  Goal(s): Patient will participate in aftercare plan  Met:  Yes  Target date: 3-5 days from date of admission   As evidenced by: Patient will participate within aftercare plan AEB aftercare provider and housing plan at discharge being identified.   11/30/15: Pt will return home and follow-up with outpatient resources  2.  Goal (s): Patient will exhibit decreased depressive symptoms and suicidal ideations.  Met:  No  Target date: 3-5 days from date of admission   As evidenced by: Patient will utilize self rating of depression at 3 or below and demonstrate decreased signs of depression or be deemed stable for discharge by MD. 11/30/15: Pt was admitted  with symptoms of depression, rating 10/10. Pt continues to present with flat affect and depressive symptoms. 12/07/15: Pt continues to rate depression at 9/10 and endorses passive SI   12/11/15: Pt reports high levels of depression and endorses SI off and on  3.  Goal(s): Patient will demonstrate decreased signs and symptoms of anxiety.  Met:  No  Target date: 3-5 days from date of admission   As evidenced by: Patient will utilize self rating of anxiety at 3 or below and demonstrated decreased signs of anxiety, or be deemed stable for discharge by MD 11/30/15: Pt was admitted with increased levels of anxiety and is currently rating those symptoms highly.  12/07/15: Pt continues to rate anxiety at 10/10. 12/11/15: Pt endorses high levels of anxiety  Attendees:  Patient:    Family:    Physician: Dr. Parke Poisson, MD  12/11/2015 10:05 AM  Nursing: Lars Pinks, RN Case manager  12/11/2015 10:05 AM  Clinical Social Worker Peri Maris, [place] 12/11/2015 10:05 AM  Other: Erasmo Downer [place], [place] 12/11/2015 10:05 AM  Clinical: Chuck Hint; Idell Pickles, RN 12/11/2015 10:05 AM  Other: , RN Charge Nurse 12/11/2015 10:05 AM  Other:     Peri Maris, Painter Work [phone]

## 2015-12-11 NOTE — [facility] Group Notes (Signed)
Adult Psychoeducational Group Note  Date:  12/11/2015 Time:  9:24 PM  Group Topic/Focus:  Wrap-Up Group:   The focus of this group is to help patients review their daily goal of treatment and discuss progress on daily workbooks.  Participation Level:  Minimal  Participation Quality:  Attentive  Affect:  Appropriate  Cognitive:  Appropriate  Insight: Limited  Engagement in Group:  Limited  Modes of Intervention:  Discussion  Additional Comments:  Pt stated her day started as a 2 but went to a 4.  Her goal was to get through the afternoon.  She has a possible discharge set for Monday.  Caroll Rancher A 12/11/2015, 9:24 PM

## 2015-12-11 NOTE — Progress Notes (Signed)
D: Patient is A&Ox4, negative for HI and A/V hallucinations. Patient does state she has some SI but contracts while she is here. Patient is interacting with the milieu and going to groups, but does present as depressed and sad. A: Patient given meds as scheduled and as needed. Continue q15 minute checks and prn. R: Patient remains safe. Patient verbalizes that she will find staff if she is no longer able to contract.  Rodney Yera, Wyman Songster, RN

## 2015-12-11 NOTE — [facility] Group Notes (Signed)
[facility] LCSW Group Therapy 12/11/2015 1:15pm  Type of Therapy: Group Therapy- Feelings Around Relapse and Recovery  Pt did not attend, declined invitation.    Chad Cordial, Theresia Majors [phone] 12/11/2015 2:36 PM

## 2015-12-11 NOTE — Progress Notes (Signed)
Patient ID: Valerie Woodard, female   DOB: [birth year], 50 y.o.   MRN: [id number] [facility] MD Progress Note  12/11/2015 1:03 PM [name]  MRN:  [id number] Subjective:     She continues to report depression, significant grief related to the death of her parents, and her poor relationship with her daughter. She denies medication side effects and states that she does feel they are helping .  Of note reports significant anxiety, particularly in the afternoons, when she tends to feel more depressed as well . Denies medication side effects. Objective: I have discussed case with treatment team and have met with patient, along with CSW  Patient remains constricted in affect, although to a less severe degree than in the past, and is currently less tearful. She does continue to state she has thoughts of killing self upon return to her house. States that on the unit she feels safe, but that she knows that " when I get back to the house that horrible feeling will set in again " at which time she would likely kills self . She has had active suicidal ideations, such as breaking a window at her home and cutting her neck. She does contract for safety on the unit, and denies any current active SI here . We discussed above at length- reviewed possible alternative disposition plans other than returning home, as she feels this would worsen her depression. Ruminative about " not having any other place to go ". She is visible on unit, going to groups, interacting with selected peers and brightening during these interactions  No disruptive or self injurious behaviors on unit. Denies medication side effects .     Principal Problem: Major depressive disorder (HCC) Diagnosis:   Patient Active Problem List   Diagnosis Date Noted  . Severe single current episode of major depressive disorder, without psychotic features (HCC) [F32.2]   . Hypothyroidism [E03.9] 12/02/2015  . Major depressive disorder (HCC) [F32.9] 11/29/2015  .  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 Metabolic syndrome [E88.81] 04/27/2014  . Other and unspecified hyperlipidemia [E78.5] 04/27/2014  . DM (diabetes mellitus) (HCC) [E11.9] 04/27/2014  . Pancreatitis [K85.9] 04/23/2014  . Acute pancreatitis [K85.90] 04/23/2014  . Chest pain [R07.9] 04/23/2014  . Chronic pain syndrome [G89.4] 04/23/2014  . Hepatic steatosis [K76.0] 04/23/2014   Total Time spent with patient: 25 minutes     Past Medical History:  Past Medical History  Diagnosis Date  . Opioid use disorder, severe, in sustained remission, on maintenance therapy 2013    on Methadone maintenance  . Vaginal delivery ~ 1993    dtr 50 yo as of 04/2014  . Chronic pain decades    mostly spinal  . Obesity 2015    230#, 104 kg, BMI 33 as of 04/2014  . DM (diabetes mellitus) (HCC) 04/27/2014    Past Surgical History  Procedure Laterality Date  . Knee arthroscopy Right ~ 1995    twice.   Family History: History reviewed. No pertinent family history.  Social History:  History  Alcohol Use No     History  Drug Use No    Social History   Social History  . Marital Status: Divorced    Spouse Name: N/A  . Number of Children: N/A  . Years of Education: N/A   Social History Main Topics  . Smoking status: Current Every Day Smoker -- 2.00 packs/day  . Smokeless tobacco: None  . Alcohol Use: No  . Drug Use: No  . Sexual Activity:  Not Asked   Other Topics Concern  . None   Social History Narrative   Additional Social History:   Sleep: improved   Appetite:    Improved   Current Medications: Current Facility-Administered Medications  Medication Dose Route Frequency Provider Last Rate Last Dose  . acetaminophen (TYLENOL) tablet 650 mg  650 mg Oral Q6H PRN Kristeen Mans, NP   650 mg at 12/10/15 2004  . alum & mag hydroxide-simeth (MAALOX/MYLANTA) 200-200-20 MG/5ML suspension 30 mL  30 mL Oral Q4H PRN Kristeen Mans, NP      . ARIPiprazole (ABILIFY) tablet 5 mg  5 mg Oral  Daily Oneta Rack, NP   5 mg at 12/11/15 0816  . DULoxetine (CYMBALTA) DR capsule 60 mg  60 mg Oral BID Craige Cotta, MD   60 mg at 12/11/15 0816  . gabapentin (NEURONTIN) capsule 300 mg  300 mg Oral BID Craige Cotta, MD   300 mg at 12/11/15 0816  . hydrOXYzine (ATARAX/VISTARIL) tablet 25 mg  25 mg Oral Q4H PRN Craige Cotta, MD   25 mg at 12/11/15 0816  . levothyroxine (SYNTHROID, LEVOTHROID) tablet 50 mcg  50 mcg Oral Q breakfast Craige Cotta, MD   50 mcg at 12/11/15 0816  . magnesium hydroxide (MILK OF MAGNESIA) suspension 30 mL  30 mL Oral Daily PRN Kristeen Mans, NP      . nicotine (NICODERM CQ - dosed in mg/24 hours) patch 21 mg  21 mg Transdermal Daily Craige Cotta, MD   21 mg at 12/11/15 0817  . zolpidem (AMBIEN) tablet 5 mg  5 mg Oral QHS Craige Cotta, MD   5 mg at 12/10/15 2134    Lab Results:  No results found for this or any previous visit (from the past 48 hour(s)).  Physical Findings: AIMS: Facial and Oral Movements Muscles of Facial Expression: None, normal Lips and Perioral Area: None, normal Jaw: None, normal Tongue: None, normal,Extremity Movements Upper (arms, wrists, hands, fingers): None, normal Lower (legs, knees, ankles, toes): None, normal, Trunk Movements Neck, shoulders, hips: None, normal, Overall Severity Severity of abnormal movements (highest score from questions above): None, normal Incapacitation due to abnormal movements: None, normal Patient's awareness of abnormal movements (rate only patient's report): No Awareness, Dental Status Current problems with teeth and/or dentures?: No Does patient usually wear dentures?: No  CIWA:  CIWA-Ar Total: 1 COWS:  COWS Total Score: 1  Musculoskeletal: Strength & Muscle Tone: within normal limits Gait & Station: normal Patient leans: N/A  Psychiatric Specialty Exam: Review of Systems  Psychiatric/Behavioral: Positive for depression. Negative for suicidal ideas and hallucinations. The  patient is nervous/anxious and has insomnia.   All other systems reviewed and are negative. denies chest pain, no shortness of breath , no vomiting , no dysuria or urgency  Blood pressure 131/77, pulse 86, temperature 98 F (36.7 C), temperature source Oral, resp. rate 18, height 5\' 7"  (1.702 m), weight 203 lb (92.08 kg), SpO2 96 %.Body mass index is 31.79 kg/(m^2).  General Appearance: casual  Eye Contact::  Good  Speech:  Normal Rate  Volume:  Normal  Mood:  Remains depressed   Affect:  Still constricted, tearful at times, but has become more reactive. Anxious   Thought Process:  Linear  Orientation:  Full (Time, Place, and Person)  Thought Content:  denies hallucinations, no delusions , not internally preoccupied   Suicidal Thoughts: reports ongoing suicidal ideations regarding returning home- see above , denies  plan or intention of hurting self or of SI on unit, contracts for safety on unit   Homicidal Thoughts:  No  Memory:  recent and remote grossly intact   Judgement:   Fair   Insight:  Improving   Psychomotor Activity:  Normal  Concentration:  Good  Recall:  Good  Fund of Knowledge:Good  Language: Good  Akathisia:  Negative  Handed:  Right  AIMS (if indicated):     Assets:  Communication Skills Desire for Improvement Resilience  ADL's:  Intact  Cognition: WNL  Sleep:  Number of Hours: 6   Assessment - Patient remains depressed, but affect has gradually improved, and noted to brighten more during interactions . Reports increasing anxiety . Denies plan or intention of suicide on unit, and contracts for safety, but states that she is likely to commit suicide on returning to her home. Tolerating Cymbalta /Abilify well . Consider Ativan PRNs for anxiety .   Treatment Plan Summary:  Daily contact with patient to assess and evaluate symptoms and progress in treatment, Medication management, Plan inpatient admission and medications as below    Encourage ongoing  milieu and  group participation to work on coping skills and symptom reduction  Continue Cymbalta   60 mg BID  For ongoing  depression Continue   Abilify 5 mgs PO QDAY for mood stabilization as augmentation Continue  Neurontin  300  mgrs BID for management of anxiety and pain  Continue Ambien 5 mgrs QHS for insomnia  D/C Vistaril Start Ativan 0.5 mgrs Q  6hours PRN for anxiety, as needed  Continue Synthroid, recently started for Hypothyroidism . As reviewed with staff, CSW, patient currently on  [facility] waiting list .  Neita Garnet, MD 12/11/2015, 1:03 PM

## 2015-12-12 MED ORDER — MENTHOL 3 MG MT LOZG: 1.0000 | LOZENGE | OROMUCOSAL | Status: DC | PRN

## 2015-12-12 NOTE — Plan of Care (Signed)
Problem: Alteration in mood Goal: LTG-Patient reports reduction in suicidal thoughts (Patient reports reduction in suicidal thoughts and is able to verbalize a safety plan for whenever patient is feeling suicidal)  Outcome: Not Progressing Patient continues to report suicidal and self harm thoughts.  She contracts for safety on the unit.

## 2015-12-12 NOTE — [facility] Group Notes (Signed)
[facility] LCSW Group Therapy  12/12/2015  10 - 11 AM  Type of Therapy:  Group Therapy  Participation Level:  None; Did not attend, despite overhead announcement of group, followed by personal knock on pt's door with encouragement to attend by LCSW  Summary of Progress/Problems: The main focus of today's process group was for the patient to identify ways in which they have in the past sabotaged their own recovery. Motivational Interviewing was utilized to identify motivation they may have for wanting to change. The Stages of Change were explained using a handout, and patients identified where they currently are with regard to stages of change.   Valerie Woodard C Valerie Kowalke, LCSW    

## 2015-12-12 NOTE — Progress Notes (Signed)
D: Patient continues to express depressive symptoms.  She rates her depression and anxiety as a 9; hopelessness as a 10.  Patient continues to reports withdrawal symptoms with no apparent symptoms noted by staff.  She presents flat, blunted affect with staff.  She has been observed in the day room laughing and interacting with her peers.  She continues to report SI with no specific plan.  She complains of blurred vision and headaches.  She is attending groups and participating.  Patient requesting ativan every 6 hours.  She denies HI/AVH. A: Continue to monitor medication management and MD orders.  Safety checks completed every 15 minutes per protocol.  Offer support and encouragement as needed. R: Patient is receptive to staff; her behavior is appropriate.

## 2015-12-12 NOTE — Progress Notes (Signed)
Patient ID: Valerie Woodard, female   DOB: May 03, 1966, 50 y.o.   MRN: [id number]   Type of Therapy:  Psychoeducational Skills  Participation Level:  Did Not Attend  Participation Quality:  Did Not Attend  Affect:  Did Not Attend  Cognitive:  Did Not Attend  Insight:  None  Engagement in Group:  Did Not Attend  Modes of Intervention:  Did Not Attend  Summary of Progress/Problems: Pt did not attend patient self inventory group.

## 2015-12-12 NOTE — Progress Notes (Signed)
  D: Pt informed the writer that she's still depressed, but contracts for safety. Stated, "the doctor doesn't want to send me home because he's waiting for me not to be so depressed". Pt has no questions or concerns.    A:  Support and encouragement was offered. 15 min checks continued for safety.  R: Pt remains safe.

## 2015-12-12 NOTE — Progress Notes (Signed)
Patient ID: Valerie Woodard, female   DOB: 1965/11/12, 50 y.o.   MRN: [id number] [facility] MD Progress Note  12/12/2015 3:05 PM Valerie Woodard  MRN:  [id number] Subjective:    Patient reports " Iam feeling okay, but  I have a sore throat'   Objective: Solana Coggin is awake, alert and oriented X3 , found resting in bedroom.   Denies suicidal or homicidal ideation. Denies auditory or visual hallucination and does not appear to be responding to internal stimuli. Patient reports interacting well with staff and others. Patient reports she is medication compliant without mediation side effects.  States her depression is "still very high like 8/10 or 9/10 most of the times". Patient states "I am feeling well today, but my throat is really sore." Reports fair appetite and states she is resting well throughout the day. Support, encouragement and reassurance was provided.     Principal Problem: Major depressive disorder (HCC) Diagnosis:   Patient Active Problem List   Diagnosis Date Noted  . Severe single current episode of major depressive disorder, without psychotic features (HCC) [F32.2]   . Hypothyroidism [E03.9] 12/02/2015  . Major depressive disorder (HCC) [F32.9] 11/29/2015  . 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 Metabolic syndrome [E88.81] 04/27/2014  . Other and unspecified hyperlipidemia [E78.5] 04/27/2014  . DM (diabetes mellitus) (HCC) [E11.9] 04/27/2014  . Pancreatitis [K85.9] 04/23/2014  . Acute pancreatitis [K85.90] 04/23/2014  . Chest pain [R07.9] 04/23/2014  . Chronic pain syndrome [G89.4] 04/23/2014  . Hepatic steatosis [K76.0] 04/23/2014   Total Time spent with patient: 25 minutes     Past Medical History:  Past Medical History  Diagnosis Date  . Opioid use disorder, severe, in sustained remission, on maintenance therapy 2013    on Methadone maintenance  . Vaginal delivery ~ 1993    dtr 50 yo as of 04/2014  . Chronic pain decades    mostly spinal  . Obesity 2015    230#, 104  kg, BMI 33 as of 04/2014  . DM (diabetes mellitus) (HCC) 04/27/2014    Past Surgical History  Procedure Laterality Date  . Knee arthroscopy Right ~ 1995    twice.   Family History: History reviewed. No pertinent family history.  Social History:  History  Alcohol Use No     History  Drug Use No    Social History   Social History  . Marital Status: Divorced    Spouse Name: N/A  . Number of Children: N/A  . Years of Education: N/A   Social History Main Topics  . Smoking status: Current Every Day Smoker -- 2.00 packs/day  . Smokeless tobacco: None  . Alcohol Use: No  . Drug Use: No  . Sexual Activity: Not Asked   Other Topics Concern  . None   Social History Narrative   Additional Social History:   Sleep: improved   Appetite:    Improved   Current Medications: Current Facility-Administered Medications  Medication Dose Route Frequency Provider Last Rate Last Dose  . acetaminophen (TYLENOL) tablet 650 mg  650 mg Oral Q6H PRN Kristeen Mans, NP   650 mg at 12/10/15 2004  . alum & mag hydroxide-simeth (MAALOX/MYLANTA) 200-200-20 MG/5ML suspension 30 mL  30 mL Oral Q4H PRN Kristeen Mans, NP      . ARIPiprazole (ABILIFY) tablet 5 mg  5 mg Oral Daily Oneta Rack, NP   5 mg at 12/12/15 0753  . DULoxetine (CYMBALTA) DR capsule 60 mg  60 mg Oral BID  Craige Cotta, MD   60 mg at 12/12/15 0753  . gabapentin (NEURONTIN) capsule 300 mg  300 mg Oral BID Craige Cotta, MD   300 mg at 12/12/15 0753  . levothyroxine (SYNTHROID, LEVOTHROID) tablet 50 mcg  50 mcg Oral Q breakfast Craige Cotta, MD   50 mcg at 12/12/15 0753  . LORazepam (ATIVAN) tablet 0.5 mg  0.5 mg Oral Q6H PRN Craige Cotta, MD   0.5 mg at 12/12/15 1353  . magnesium hydroxide (MILK OF MAGNESIA) suspension 30 mL  30 mL Oral Daily PRN Kristeen Mans, NP      . nicotine (NICODERM CQ - dosed in mg/24 hours) patch 21 mg  21 mg Transdermal Daily Craige Cotta, MD   21 mg at 12/12/15 0756  . zolpidem (AMBIEN)  tablet 5 mg  5 mg Oral QHS Craige Cotta, MD   5 mg at 12/11/15 2117    Lab Results:  No results found for this or any previous visit (from the past 48 hour(s)).  Physical Findings: AIMS: Facial and Oral Movements Muscles of Facial Expression: None, normal Lips and Perioral Area: None, normal Jaw: None, normal Tongue: None, normal,Extremity Movements Upper (arms, wrists, hands, fingers): None, normal Lower (legs, knees, ankles, toes): None, normal, Trunk Movements Neck, shoulders, hips: None, normal, Overall Severity Severity of abnormal movements (highest score from questions above): None, normal Incapacitation due to abnormal movements: None, normal Patient's awareness of abnormal movements (rate only patient's report): No Awareness, Dental Status Current problems with teeth and/or dentures?: No Does patient usually wear dentures?: No  CIWA:  CIWA-Ar Total: 1 COWS:  COWS Total Score: 1  Musculoskeletal: Strength & Muscle Tone: within normal limits Gait & Station: normal Patient leans: N/A  Psychiatric Specialty Exam: Review of Systems  HENT: Positive for sore throat.   Psychiatric/Behavioral: Positive for depression. Negative for suicidal ideas and hallucinations. The patient is nervous/anxious and has insomnia.   All other systems reviewed and are negative. denies chest pain, no shortness of breath , no vomiting , no dysuria or urgency  Blood pressure 143/84, pulse 91, temperature 98.8 F (37.1 C), temperature source Oral, resp. rate 16, height  (1.702 m), weight 92.08 kg (203 lb), SpO2 96 %.Body mass index is 31.79 kg/(m^2).  General Appearance: casual. pleasant and clam  Eye Contact::  Good  Speech:  Normal Rate  Volume:  Normal  Mood:  Remains depressed   Affect:  Still constricted, tearful at times, but has become more reactive. Anxious   Thought Process:  Linear  Orientation:  Full (Time, Place, and Person)  Thought Content:  denies hallucinations, no  delusions , not internally preoccupied   Suicidal Thoughts:denies suicidal ideation today, passive with thoughts process  Homicidal Thoughts:  No  Memory:  recent and remote grossly intact   Judgement:   Fair   Insight:  Improving   Psychomotor Activity:  Normal  Concentration:  Good  Recall:  Good  Fund of Knowledge:Good  Language: Good  Akathisia:  Negative  Handed:  Right  AIMS (if indicated):     Assets:  Communication Skills Desire for Improvement Resilience  ADL's:  Intact  Cognition: WNL  Sleep:  Number of Hours: 6.25    I agree with current treatment plan on 12/12/2015, Patient seen face-to-face for psychiatric evaluation follow-up, chart reviewed. Reviewed the information documented and agree with the treatment plan.  Treatment Plan Summary:  Daily contact with patient to assess and evaluate symptoms and  progress in treatment, Medication management, Plan inpatient admission and medications as below    Encourage ongoing  milieu and group participation to work on coping skills and symptom reduction Order for throat culture  Start throat lozenge (cepacol) 3mg  PRN for sore throat  Continue Cymbalta   60 mg BID  For ongoing  depression Continue   Abilify 5 mgs PO QDAY for mood stabilization as augmentation Continue  Neurontin  300  mgrs BID for management of anxiety and pain  Continue Ambien 5 mgrs QHS for insomnia  D/C Vistaril Start Ativan 0.5 mgrs Q  6hours PRN for anxiety, as needed  Continue Synthroid, recently started for Hypothyroidism . As reviewed with staff, CSW, patient currently on  [facility] waiting list .  Oneta Rack, NP 12/12/2015, 3:05 PM I agreed with findings and treatment plan of this patient

## 2015-12-12 NOTE — Plan of Care (Signed)
Problem: Alteration in mood Goal: LTG-Patient reports reduction in suicidal thoughts (Patient reports reduction in suicidal thoughts and is able to verbalize a safety plan for whenever patient is feeling suicidal)  Outcome: Not Progressing Pt SI-contracts for safety     

## 2015-12-12 NOTE — Progress Notes (Signed)
D: Pt passive SI-contracts for safety. Pt is pleasant and cooperative. Pt stated she was feeling better due to her medications.   A: Pt was offered support and encouragement. Pt was given scheduled medications. Pt was encourage to attend groups. Q 15 minute checks were done for safety.   R:Pt attends groups and interacts well with peers and staff. Pt is taking medication. Pt has no complaints at this time .Pt receptive to treatment and safety maintained on unit.

## 2015-12-13 NOTE — Progress Notes (Signed)
[facility] MD Progress Note  12/13/2015 2:48 PM Valerie Woodard  MRN:  [id number] Subjective:    Patient reports " Iam feeling okay, my throat is still sore."   Objective: Valerie Woodard is awake, alert and oriented X3 , found resting in bedroom.   Denies suicidal or homicidal ideation. Denies auditory or visual hallucination and does not appear to be responding to internal stimuli. Patient reports interacting well with staff and others. Patient reports she is medication compliant without mediation side effects.  States her depression is "still very high like 8/10 or 9/10 most of the times". Patient states "I am feeling well today, but my throat is really sore." patient denies any other symptoms, nausea, fever, headache, patient denies pain or discomfort with swallowing. Reports fair appetite and states she is resting well throughout the day. Support, encouragement and reassurance was provided.     Principal Problem: Major depressive disorder (HCC) Diagnosis:   Patient Active Problem List   Diagnosis Date Noted  . Severe single current episode of major depressive disorder, without psychotic features (HCC) [F32.2]   . Hypothyroidism [E03.9] 12/02/2015  . Major depressive disorder (HCC) [F32.9] 11/29/2015  . 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 Metabolic syndrome [E88.81] 04/27/2014  . Other and unspecified hyperlipidemia [E78.5] 04/27/2014  . DM (diabetes mellitus) (HCC) [E11.9] 04/27/2014  . Pancreatitis [K85.9] 04/23/2014  . Acute pancreatitis [K85.90] 04/23/2014  . Chest pain [R07.9] 04/23/2014  . Chronic pain syndrome [G89.4] 04/23/2014  . Hepatic steatosis [K76.0] 04/23/2014   Total Time spent with patient: 25 minutes     Past Medical History:  Past Medical History  Diagnosis Date  . Opioid use disorder, severe, in sustained remission, on maintenance therapy 2013    on Methadone maintenance  . Vaginal delivery ~ 1993    dtr 50 yo as of 04/2014  . Chronic pain decades    mostly  spinal  . Obesity 2015    230#, 104 kg, BMI 33 as of 04/2014  . DM (diabetes mellitus) (HCC) 04/27/2014    Past Surgical History  Procedure Laterality Date  . Knee arthroscopy Right ~ 1995    twice.   Family History: History reviewed. No pertinent family history.  Social History:  History  Alcohol Use No     History  Drug Use No    Social History   Social History  . Marital Status: Divorced    Spouse Name: N/A  . Number of Children: N/A  . Years of Education: N/A   Social History Main Topics  . Smoking status: Current Every Day Smoker -- 2.00 packs/day  . Smokeless tobacco: None  . Alcohol Use: No  . Drug Use: No  . Sexual Activity: Not Asked   Other Topics Concern  . None   Social History Narrative   Additional Social History:   Sleep: improved   Appetite:    Improved   Current Medications: Current Facility-Administered Medications  Medication Dose Route Frequency Provider Last Rate Last Dose  . acetaminophen (TYLENOL) tablet 650 mg  650 mg Oral Q6H PRN Kristeen Mans, NP   650 mg at 12/13/15 1136  . alum & mag hydroxide-simeth (MAALOX/MYLANTA) 200-200-20 MG/5ML suspension 30 mL  30 mL Oral Q4H PRN Kristeen Mans, NP      . ARIPiprazole (ABILIFY) tablet 5 mg  5 mg Oral Daily Oneta Rack, NP   5 mg at 12/13/15 0756  . DULoxetine (CYMBALTA) DR capsule 60 mg  60 mg Oral BID Rockey Situ  Cobos, MD   60 mg at 12/13/15 0756  . gabapentin (NEURONTIN) capsule 300 mg  300 mg Oral BID Craige Cotta, MD   300 mg at 12/13/15 0755  . levothyroxine (SYNTHROID, LEVOTHROID) tablet 50 mcg  50 mcg Oral Q breakfast Craige Cotta, MD   50 mcg at 12/13/15 0755  . LORazepam (ATIVAN) tablet 0.5 mg  0.5 mg Oral Q6H PRN Craige Cotta, MD   0.5 mg at 12/13/15 1316  . magnesium hydroxide (MILK OF MAGNESIA) suspension 30 mL  30 mL Oral Daily PRN Kristeen Mans, NP      . menthol-cetylpyridinium (CEPACOL) lozenge 3 mg  1 lozenge Oral PRN Oneta Rack, NP      . nicotine (NICODERM  CQ - dosed in mg/24 hours) patch 21 mg  21 mg Transdermal Daily Craige Cotta, MD   21 mg at 12/13/15 0756  . zolpidem (AMBIEN) tablet 5 mg  5 mg Oral QHS Craige Cotta, MD   5 mg at 12/12/15 2121    Lab Results:  No results found for this or any previous visit (from the past 48 hour(s)).  Physical Findings: AIMS: Facial and Oral Movements Muscles of Facial Expression: None, normal Lips and Perioral Area: None, normal Jaw: None, normal Tongue: None, normal,Extremity Movements Upper (arms, wrists, hands, fingers): None, normal Lower (legs, knees, ankles, toes): None, normal, Trunk Movements Neck, shoulders, hips: None, normal, Overall Severity Severity of abnormal movements (highest score from questions above): None, normal Incapacitation due to abnormal movements: None, normal Patient's awareness of abnormal movements (rate only patient's report): No Awareness, Dental Status Current problems with teeth and/or dentures?: No Does patient usually wear dentures?: No  CIWA:  CIWA-Ar Total: 1 COWS:  COWS Total Score: 1  Musculoskeletal: Strength & Muscle Tone: within normal limits Gait & Station: normal Patient leans: N/A  Psychiatric Specialty Exam: Review of Systems  HENT: Positive for sore throat.   Psychiatric/Behavioral: Positive for depression. Negative for suicidal ideas and hallucinations. The patient is nervous/anxious and has insomnia.   All other systems reviewed and are negative.   Blood pressure 130/75, pulse 88, temperature 98.5 F (36.9 C), temperature source Oral, resp. rate 18, height  (1.702 m), weight 92.08 kg (203 lb), SpO2 96 %.Body mass index is 31.79 kg/(m^2).  General Appearance: casual. pleasant and clam  Eye Contact::  Good  Speech:  Normal Rate  Volume:  Normal  Mood:  Remains depressed   Affect:  Still constricted, tearful at times, but has become more reactive. Anxious   Thought Process:  Linear  Orientation:  Full (Time, Place, and  Person)  Thought Content:  denies hallucinations, no delusions , not internally preoccupied   Suicidal Thoughts:denies suicidal ideation today, passive with thoughts process  Homicidal Thoughts:  No  Memory:  recent and remote grossly intact   Judgement:   Fair   Insight:  Improving   Psychomotor Activity:  Normal  Concentration:  Good  Recall:  Good  Fund of Knowledge:Good  Language: Good  Akathisia:  Negative  Handed:  Right  AIMS (if indicated):     Assets:  Communication Skills Desire for Improvement Resilience  ADL's:  Intact  Cognition: WNL  Sleep:  Number of Hours: 6.25    I agree with current treatment plan on 12/13/2015, Patient seen face-to-face for psychiatric evaluation follow-up, chart reviewed. Reviewed the information documented and agree with the treatment plan.  Treatment Plan Summary:  Daily contact with patient to assess  and evaluate symptoms and progress in treatment, Medication management, Plan inpatient admission and medications as below    Encourage ongoing  milieu and group participation to work on coping skills and symptom reduction Order for throat culture- pending results Start throat lozenge (cepacol)  PRN for sore throat  Continue Cymbalta   60 mg BID  For ongoing  depression Continue   Abilify 5 mgs PO QDAY for mood stabilization as augmentation Continue  Neurontin  300  mgrs BID for management of anxiety and pain  Continue Ambien 5 mgrs QHS for insomnia  D/C Vistaril Continue with Ativan 0.5 mgrs Q  6hours PRN for anxiety, as needed  Continue Synthroid, recently started for Hypothyroidism . As reviewed with staff, CSW, patient currently on  [facility] waiting list .  Oneta Rack, NP 12/13/2015, 2:48 PM I agreed with findings and treatment plan of this patient

## 2015-12-13 NOTE — [facility] Group Notes (Signed)
[facility] Group Notes:  (Nursing/MHT/Case Management/Adjunct)  Date:  12/13/2015  Time:  10:36 AM  Type of Therapy:  Psychoeducational Skills  Participation Level:  Did Not Attend  Participation Quality:  Did Not Attend  Affect:  Did Not Attend  Cognitive:  Did Not Attend  Insight:  None  Engagement in Group:  Did Not Attend  Modes of Intervention:  Did Not Attend  Summary of Progress/Problems: Pt did not attend patient self inventory group.   Kolbie Clarkston Shanta 12/13/2015, 10:36 AM 

## 2015-12-13 NOTE — Progress Notes (Signed)
Group Note  Pt attended wrap-up group; participated actively. Appropriate behavior. 

## 2015-12-13 NOTE — Progress Notes (Signed)
Patient ID: Valerie Woodard, female   DOB: November 06, 1965, 50 y.o.   MRN: [id number]  DAR: Pt. Denies HI and A/V Hallucinations. She reports passive SI but is able to contract for safety. She reports sleep was poor, appetite is poor, energy level is low, and concentration is poor. She rates depression 9/10, hopelessness 9/10, and anxiety 10/10. Patient does report pain in hips and had a headache this morning. She received Tylenol to decrease pain. Support and encouragement provided to the patient. She is seen in the milieu interacting with her peers and laughing however appears sullen in front of Clinical research associate. Scheduled medications administered to patient per physician's orders. PRN Ativan administered to patient for anxiety which provided relief. Q15 minute checks are maintained for safety.

## 2015-12-13 NOTE — [facility] Group Notes (Signed)
[facility] LCSW Group Therapy Note   12/13/2015  10 AM   Type of Therapy and Topic: Group Therapy: Feelings Around Returning Home & Establishing a Supportive Framework and Activity to Identify signs of Improvement or Decompensation   Participation Level:  Did not attend although encouraged by MHT and CSW in addition to overhead announcement.   Mendel Binsfeld C Lorella Gomez, LCSW     

## 2015-12-14 LAB — GLUCOSE, CAPILLARY: GLUCOSE-CAPILLARY: 119 mg/dL — AB (ref 65–99)

## 2015-12-14 MED ORDER — NAPROXEN 500 MG PO TABS
500.0000 mg | ORAL_TABLET | Freq: Once | ORAL | Status: AC
  Administered 2015-12-15: 500 mg via ORAL
  Filled 2015-12-14 (×2): qty 1

## 2015-12-14 NOTE — Progress Notes (Signed)
Group Note  Pt attended wrap-up group; Pt was inactive. Appropriate behavior. 

## 2015-12-14 NOTE — [facility] Group Notes (Addendum)
[facility] LCSW Aftercare Discharge Planning Group Note  12/14/2015 8:45 AM  Pt attended group but declined participation.  Chad Cordial, LCSWA 12/14/2015 3:29 PM

## 2015-12-14 NOTE — Progress Notes (Signed)
D: Pt presents with depressed affect and mood.  Pt reports her day was "good until a little while ago."  She did not elaborate and reported that she was feeling anxious.  Pt reports passive SI without a plan.  She verbally contracts for safety.  Pt is tearful at times.  Pt denies HI, denies hallucinations, reports bilateral hip pain of 9/10.  Pt has been visible in the milieu interacting with peers and staff appropriately.  She attended evening group.    A: Introduced self to pt.  Medication administered per order.  Actively listened to pt and provided support and encouragement.  PRN medication administered for pain and anxiety.    R: Pt is compliant with medications.  She verbally contracts for safety and reports that she will inform staff of needs and concerns.  Pt is safe on the unit.  Will continue to monitor and assess.

## 2015-12-14 NOTE — Plan of Care (Signed)
Problem: Diagnosis: Increased Risk For Suicide Attempt Goal: STG-Patient Will Attend All Groups On The Unit Outcome: Progressing Pt attended evening group on 12/13/15     

## 2015-12-14 NOTE — Plan of Care (Signed)
Problem: Diagnosis: Increased Risk For Suicide Attempt Goal: STG-Patient Will Report Suicidal Feelings to Staff Outcome: Progressing She reports SI, can contract for safety.

## 2015-12-14 NOTE — [facility] Group Notes (Signed)
[facility] LCSW Group Therapy  12/14/2015 1:15pm  Type of Therapy:  Group Therapy vercoming Obstacles  Participation Level:  Minimal  Participation Quality:  Withdrawn   Affect:  Flat; Tearful  Cognitive:  Appropriate and Oriented  Insight:  Limited  Engagement in Therapy:  Limited  Modes of Intervention:  Discussion, Exploration, Problem-solving and Support  Description of Group:   In this group patients will be encouraged to explore what they see as obstacles to their own wellness and recovery. They will be guided to discuss their thoughts, feelings, and behaviors related to these obstacles. The group will process together ways to cope with barriers, with attention given to specific choices patients can make. Each patient will be challenged to identify changes they are motivated to make in order to overcome their obstacles. This group will be process-oriented, with patients participating in exploration of their own experiences as well as giving and receiving support and challenge from other group members.  Summary of Patient Progress: Pt attended group but did not participate. She was observed to have her face covered and be tearful.   Therapeutic Modalities:   Cognitive Behavioral Therapy Solution Focused Therapy Motivational Interviewing Relapse Prevention Therapy   Chad Cordial, LCSWA 12/14/2015 3:32 PM

## 2015-12-14 NOTE — Significant Event (Cosign Needed)
Notified by NS patient had an unobserved fall at approx 23: 15 pm when she was ambulating to the bathroom. There is no reported LOC, per patient she fell bracing herself on the right side, hitting her head against the wall then falling on the floor unto her right hip and extended RUE. Patient has received a hypnotic one hour prior and a benzodiazepine several hours prior. Patient denies any presyncopal symptoms i.e. CP, palpitations, SOB, diaphoresis, vertigo or nausea. On physical exam she is A & O x 3 spheres, VS are WNL, BG wnl, Integument is W/D, Neuro CN 2 - 12 grossly intact, Finger grasp strength 5/5 UE bilat, HEENT PERRLA bilat, Neck soft supple with full active ROM, Pulm CTA, Cardio Audible S1,S2 without M/G/R. Assessment mechanical fall possibly precipitated by hypnotic agent without focal neurologic deficits, Plan to continue with q 15 minute standard checks defer any additional dosing of hypnotics and or benzodiazepines at this time.

## 2015-12-14 NOTE — Progress Notes (Signed)
[facility] MD Progress Note  12/14/2015 3:28 PM Valerie Woodard  MRN:  [id number] Subjective:    Patient reports " Iam feeling okay, but I need more ativan, this ativan that I am taking now doesn't last the full 6 hours."   Objective: Valerie Woodard is awake, alert and oriented X3 , found sitting in the dayroom interacting with peers.   Denies suicidal or homicidal ideation. Denies auditory or visual hallucination and does not appear to be responding to internal stimuli. Patient reports interacting well with staff and others. Patient reports she is medication compliant without mediation side effects.  States her depression and anxiety is high 9/10 most of the times". Reports that the ativan wears off to quickly.  Patient states "I am feeling better my throat has cleared up"  patient  Reports fair appetite and states she is resting well throughout the day. Support, encouragement and reassurance was provided.     Principal Problem: Major depressive disorder (HCC) Diagnosis:   Patient Active Problem List   Diagnosis Date Noted  . Severe single current episode of major depressive disorder, without psychotic features (HCC) [F32.2]   . Hypothyroidism [E03.9] 12/02/2015  . Major depressive disorder (HCC) [F32.9] 11/29/2015  . 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 Metabolic syndrome [E88.81] 04/27/2014  . Other and unspecified hyperlipidemia [E78.5] 04/27/2014  . DM (diabetes mellitus) (HCC) [E11.9] 04/27/2014  . Pancreatitis [K85.9] 04/23/2014  . Acute pancreatitis [K85.90] 04/23/2014  . Chest pain [R07.9] 04/23/2014  . Chronic pain syndrome [G89.4] 04/23/2014  . Hepatic steatosis [K76.0] 04/23/2014   Total Time spent with patient: 25 minutes     Past Medical History:  Past Medical History  Diagnosis Date  . Opioid use disorder, severe, in sustained remission, on maintenance therapy 2013    on Methadone maintenance  . Vaginal delivery ~ 1993    dtr 50 yo as of 04/2014  . Chronic pain decades   mostly spinal  . Obesity 2015    230#, 104 kg, BMI 33 as of 04/2014  . DM (diabetes mellitus) (HCC) 04/27/2014    Past Surgical History  Procedure Laterality Date  . Knee arthroscopy Right ~ 1995    twice.   Family History: History reviewed. No pertinent family history.  Social History:  History  Alcohol Use No     History  Drug Use No    Social History   Social History  . Marital Status: Divorced    Spouse Name: N/A  . Number of Children: N/A  . Years of Education: N/A   Social History Main Topics  . Smoking status: Current Every Day Smoker -- 2.00 packs/day  . Smokeless tobacco: None  . Alcohol Use: No  . Drug Use: No  . Sexual Activity: Not Asked   Other Topics Concern  . None   Social History Narrative   Additional Social History:   Sleep: improved   Appetite:    Improved   Current Medications: Current Facility-Administered Medications  Medication Dose Route Frequency Provider Last Rate Last Dose  . acetaminophen (TYLENOL) tablet 650 mg  650 mg Oral Q6H PRN Kristeen Mans, NP   650 mg at 12/13/15 2013  . alum & mag hydroxide-simeth (MAALOX/MYLANTA) 200-200-20 MG/5ML suspension 30 mL  30 mL Oral Q4H PRN Kristeen Mans, NP      . ARIPiprazole (ABILIFY) tablet 5 mg  5 mg Oral Daily Oneta Rack, NP   5 mg at 12/14/15 0754  . DULoxetine (CYMBALTA) DR capsule 60 mg  60 mg Oral BID Craige Cotta, MD   60 mg at 12/14/15 0754  . gabapentin (NEURONTIN) capsule 300 mg  300 mg Oral BID Craige Cotta, MD   300 mg at 12/14/15 0754  . levothyroxine (SYNTHROID, LEVOTHROID) tablet 50 mcg  50 mcg Oral Q breakfast Craige Cotta, MD   50 mcg at 12/14/15 0754  . LORazepam (ATIVAN) tablet 0.5 mg  0.5 mg Oral Q6H PRN Craige Cotta, MD   0.5 mg at 12/14/15 1322  . magnesium hydroxide (MILK OF MAGNESIA) suspension 30 mL  30 mL Oral Daily PRN Kristeen Mans, NP      . menthol-cetylpyridinium (CEPACOL) lozenge 3 mg  1 lozenge Oral PRN Oneta Rack, NP      . nicotine  (NICODERM CQ - dosed in mg/24 hours) patch 21 mg  21 mg Transdermal Daily Craige Cotta, MD   21 mg at 12/14/15 1610  . zolpidem (AMBIEN) tablet 5 mg  5 mg Oral QHS Craige Cotta, MD   5 mg at 12/13/15 2107    Lab Results:  Results for orders placed or performed during the hospital encounter of 11/28/15 (from the past 48 hour(s))  Culture, group A strep     Status: None (Preliminary result)   Collection Time: 12/12/15  4:30 PM  Result Value Ref Range   Specimen Description      THROAT Performed at [facility]    Special Requests      Normal Performed at [facility]    Culture      CULTURE REINCUBATED FOR BETTER GROWTH Performed at [facility]    Report Status PENDING     Physical Findings: AIMS: Facial and Oral Movements Muscles of Facial Expression: None, normal Lips and Perioral Area: None, normal Jaw: None, normal Tongue: None, normal,Extremity Movements Upper (arms, wrists, hands, fingers): None, normal Lower (legs, knees, ankles, toes): None, normal, Trunk Movements Neck, shoulders, hips: None, normal, Overall Severity Severity of abnormal movements (highest score from questions above): None, normal Incapacitation due to abnormal movements: None, normal Patient's awareness of abnormal movements (rate only patient's report): No Awareness, Dental Status Current problems with teeth and/or dentures?: No Does patient usually wear dentures?: No  CIWA:  CIWA-Ar Total: 1 COWS:  COWS Total Score: 1  Musculoskeletal: Strength & Muscle Tone: within normal limits Gait & Station: normal Patient leans: N/A  Psychiatric Specialty Exam: Review of Systems  HENT: Positive for sore throat.   Psychiatric/Behavioral: Positive for depression. Negative for suicidal ideas and hallucinations. The patient is nervous/anxious and has insomnia.   All other systems reviewed and are negative.   Blood pressure 117/69, pulse 95, temperature  97.8 F (36.6 C), temperature source Oral, resp. rate 16, height  (1.702 m), weight 92.08 kg (203 lb), SpO2 96 %.Body mass index is 31.79 kg/(m^2).  General Appearance: casual. pleasant and clam  Eye Contact::  Good  Speech:  Normal Rate  Volume:  Normal  Mood:  Remains depressed 9/10  Affect:  Still constricted, tearful at times, but has become more reactive. Anxious 8/10  Thought Process:  Linear  Orientation:  Full (Time, Place, and Person)  Thought Content:  denies hallucinations, no delusions , not internally preoccupied   Suicidal Thoughts:denies suicidal ideation today, passive with thoughts .   Homicidal Thoughts:  No  Memory:  recent and remote grossly intact   Judgement:   Fair   Insight:  Improving   Psychomotor  Activity:  Normal  Concentration:  Good  Recall:  Good  Fund of Knowledge:Good  Language: Good  Akathisia:  Negative  Handed:  Right  AIMS (if indicated):     Assets:  Communication Skills Desire for Improvement Resilience  ADL's:  Intact  Cognition: WNL  Sleep:  Number of Hours: 3    I agree with current treatment plan on 12/14/2015, Patient seen face-to-face for psychiatric evaluation follow-up, chart reviewed. Reviewed the information documented and agree with the treatment plan.  Treatment Plan Summary:  Daily contact with patient to assess and evaluate symptoms and progress in treatment, Medication management, Plan inpatient admission and medications as below    Encourage ongoing  milieu and group participation to work on coping skills and symptom reduction Encouraged coping skills Order for throat culture- pending results Contiune throat lozenge (Cepacol) 3mg  PRN for sore throat  Continue Cymbalta   60 mg BID  For ongoing  depression Continue   Abilify 5 mgs PO QDAY for mood stabilization as augmentation Continue  Neurontin  300  mgrs BID for management of anxiety and pain  Continue Ambien 5 mgrs QHS for insomnia  D/C Vistaril Continue with  Ativan 0.5 mgrs Q  6hours PRN for anxiety, as needed  Continue Synthroid, recently started for Hypothyroidism . As reviewed with staff, CSW, patient currently on  [facility] waiting list .  Oneta Rack, NP 12/14/2015, 3:28 PM I agree with assessment and plan Reymundo Poll. Dub Mikes, M.D.

## 2015-12-14 NOTE — Progress Notes (Signed)
Patient ID: Valerie Woodard, female   DOB: January 16, 1966, 50 y.o.   MRN: [id number]  DAR: Pt. Denies HI and A/V Hallucinations. She reports passive SI, able to contract for safety. She reports sleep was poor, appetite is fair, energy level is low, and concentration is poor. She rates depression and anxiety 9/10, and hopelessness 10/10. Patient does report pain in hips that is chronic in nature but refuses intervention at this time. Support and encouragement provided to the patient. Scheduled medications administered to patient per physician's orders. Patient is seen in the milieu interacting with peers and attending groups. She remains somewhat childlike in interaction with Clinical research associate. Q15 minute checks are maintained for safety.

## 2015-12-15 LAB — CULTURE, GROUP A STREP (THRC): SPECIAL REQUESTS: NORMAL

## 2015-12-15 MED ORDER — ARIPIPRAZOLE 5 MG PO TABS: 5.0000 mg | ORAL_TABLET | Freq: Every day | ORAL | Status: AC

## 2015-12-15 MED ORDER — LEVOTHYROXINE SODIUM 50 MCG PO TABS: 50.0000 ug | ORAL_TABLET | Freq: Every day | ORAL | Status: AC

## 2015-12-15 MED ORDER — DULOXETINE HCL 60 MG PO CPEP: 60.0000 mg | ORAL_CAPSULE | Freq: Two times a day (BID) | ORAL | Status: AC

## 2015-12-15 MED ORDER — NICOTINE 21 MG/24HR TD PT24: 21.0000 mg | MEDICATED_PATCH | Freq: Every day | TRANSDERMAL | Status: AC

## 2015-12-15 MED ORDER — GABAPENTIN 300 MG PO CAPS: 300.0000 mg | ORAL_CAPSULE | Freq: Two times a day (BID) | ORAL | Status: AC

## 2015-12-15 MED ORDER — LORAZEPAM 0.5 MG PO TABS
0.5000 mg | ORAL_TABLET | Freq: Three times a day (TID) | ORAL | Status: DC
  Administered 2015-12-15: 0.5 mg via ORAL
  Filled 2015-12-15: qty 1

## 2015-12-15 NOTE — BHH Suicide Risk Assessment (Addendum)
[facility] Discharge Suicide Risk Assessment   Principal Problem: Major depressive disorder [facility]) Discharge Diagnoses:  Patient Active Problem List   Diagnosis Date Noted  . Severe single current episode of major depressive disorder, without psychotic features (HCC) [F32.2]   . Hypothyroidism [E03.9] 12/02/2015  . Major depressive disorder (HCC) [F32.9] 11/29/2015  . 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 Metabolic syndrome [E88.81] 04/27/2014  . Other and unspecified hyperlipidemia [E78.5] 04/27/2014  . DM (diabetes mellitus) (HCC) [E11.9] 04/27/2014  . Pancreatitis [K85.9] 04/23/2014  . Acute pancreatitis [K85.90] 04/23/2014  . Chest pain [R07.9] 04/23/2014  . Chronic pain syndrome [G89.4] 04/23/2014  . Hepatic steatosis [K76.0] 04/23/2014    Total Time spent with patient: 30 minutes  Musculoskeletal: Strength & Muscle Tone: within normal limits- of note, patient states she fell yesterday, at this time denies any discomfort other than some leg discomfort , but is walking without difficulty . Gait & Station: normal Patient leans: N/A  Psychiatric Specialty Exam: ROS  Blood pressure 135/73, pulse 80, temperature 98 F (36.7 C), temperature source Oral, resp. rate 18, height  (1.702 m), weight 203 lb (92.08 kg), SpO2 98 %.Body mass index is 31.79 kg/(m^2).  General Appearance: Well Groomed  Patent attorney::  Good  Speech:  Normal Rate409  Volume:  Decreased  Mood:  Depressed  Affect:  Constricted and but more reactive than on admission  Thought Process:  Linear  Orientation:  Full (Time, Place, and Person)  Thought Content:  denies hallucinations,  no delusions   Suicidal Thoughts:  has reported persistent passive thoughts of death, but denies any plan or intetion of suicide at this time. She presents future oriented, speaking about disability application and about upcoming arbitration appointment regarding mother's care/ estate   She states that if she develops SI again  she  intends to return to hospital   Homicidal Thoughts:  No  Memory:  recent and remote grossly intact  Judgement:  Other:  improving   Insight:  Fair  Psychomotor Activity:  Normal  Concentration:  Good  Recall:  Good  Fund of Knowledge:Good  Language: Good  Akathisia:  Negative  Handed:  Right  AIMS (if indicated):     Assets:  Communication Skills Desire for Improvement Resilience  Sleep:  Number of Hours: 5.5  Cognition: WNL  ADL's:  Intact   Mental Status Per Nursing Assessment::   On Admission:     Demographic Factors:  50 year old female, lives alone, one adult daughter  Loss Factors: Death of parents, poor relationship with her adult daughter , dealing with legal proceedings regarding nursing home care of mother when she was alive   Historical Factors: History of depression  Risk Reduction Factors:   Sense of responsibility to family and Positive coping skills or problem solving skills  Continued Clinical Symptoms:  Alert, attentive, cooperative, calm, mood remains depressed, but has improved partially since admission, affect constricted, reactive at times, does smile appropriately at times, no thought disorder, has expressed chronic passive  thoughts of death, missing her mother, who passed away last year, but  at this time denies suicidal plan or intention, denies HI, no psychotic symptoms. More future oriented, focusing more on applying for disability and an upcoming arbitration hearing about two weeks from now, which she needs to attend via teleconference . As per staff, patient's affect brightens in group interactions, milieu.  No self injurious behaviors on unit.  Denies medication side effects. States Ativan has been very helpful to decrease  anxiety, denies side effects. Continue Ativan 0.5 mgrs TID for anxiety- side effects, to include sedation and abuse potential, reviewed. Ambien D/Cd prior to discharge .  Patient requesting discharge, as she feels more prepared  to address her psychosocial stressors   Cognitive Features That Contribute To Risk:  No gross cognitive deficits noted upon discharge. Is alert , attentive, and oriented x 3    Suicide Risk:  Moderate:  Frequent suicidal ideation with limited intensity, and duration, some specificity in terms of plans, no associated intent, good self-control, limited dysphoria/symptomatology, some risk factors present, and identifiable protective factors, including available and accessible social support.  Follow-up Information    Follow up with [facility] On 12/16/2015.   Why:  at 8:30am for your hospital discharge appointment with Nyra Jabs   Contact information:   [address] [place]  [address] Telephone:  [phone]      Plan Of Care/Follow-up recommendations:  Activity:  as tolerated  Diet:  Regular Tests:  NA Other:  See below  Patient reports she feels she is ready for discharge, and is requesting discharge . No current grounds for involuntary commitment.   Follow up as above . Patient also encouraged to pursue grief therapy via hospice services  Patient to follow up with PCP regarding  Ongoing management of hypothyroidism.   Nehemiah Massed, MD 12/15/2015, 12:32 PM

## 2015-12-15 NOTE — Progress Notes (Signed)
D: Patient observed in dayroom interacting with peers. Patient sates goal for the day was to " get throughout the day without hurting herself." Patient remained safe today and no attempts to harm self. Patient did not engage in conversation only to a minimal with this Clinical research associate.  A: Support and encouragement offered. Q 15 minute checks in progress and maintained.  R: Patient remains safe on unit and monitoring continues.

## 2015-12-15 NOTE — Progress Notes (Signed)
Patient stated she was ambulating to bathroom and bumped into the wall and fell on right side. Patient assessed and no noted injuries found. Patient head without bruising, no redness noted. Patient with c/o pain to right hip. ROM to all extremities and no pain voiced with movement. Extension and flexion without difficulty or discomfort voiced or noted. Patient reeducated on fall prevention and instructed to call for assistance prior to ambulating. Patient educated on medications received tonight and side effects. Patient educated that she does need to try and lay down post sleep medication and make sure she is completely awake prior to ambulating. Patient seen by PA Karleen Hampshire. Patient assisted to bed and ice pack applied to right side of head where she walked into wall. Patient grips are equal, PERLA, ROM to all extremities without difficulty, Follows commands, alert and oriented and speech is clear. Monitoring continues.

## 2015-12-15 NOTE — BHH Group Notes (Signed)
BHH Group Notes:  (Nursing/MHT/Case Management/Adjunct)  Date:  12/15/2015  Time:  11:32 AM  Type of Therapy:  Psychoeducational Skills  Participation Level:  Did Not Attend  Participation Quality:  N/A  Affect:  N/A  Cognitive:  N/A  Insight:  None  Engagement in Group:  None  Modes of Intervention:  Discussion and Education  Summary of Progress/Problems: Patient was invited to group but chose not to attend.   Jefferson Fullam E 12/15/2015, 11:32 AM 

## 2015-12-15 NOTE — Discharge Summary (Signed)
Physician Discharge Summary Note  Patient:  Valerie Woodard is an 50 y.o., female MRN:  [id number] DOB:  1966-06-01 Patient phone:  [phone] (home)  Patient address:   [address] [place] [address],  Total Time spent with patient: 30 minutes  Date of Admission:  11/28/2015 Date of Discharge: 12/15/2015  Reason for Admission:  Depression  Principal Problem: Major depressive disorder [facility]) Discharge Diagnoses: Patient Active Problem List   Diagnosis Date Noted  . Severe single current episode of major depressive disorder, without psychotic features (HCC) [F32.2]   . Hypothyroidism [E03.9] 12/02/2015  . Major depressive disorder (HCC) [F32.9] 11/29/2015  . 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 Metabolic syndrome [E88.81] 04/27/2014  . Other and unspecified hyperlipidemia [E78.5] 04/27/2014  . DM (diabetes mellitus) (HCC) [E11.9] 04/27/2014  . Pancreatitis [K85.9] 04/23/2014  . Acute pancreatitis [K85.90] 04/23/2014  . Chest pain [R07.9] 04/23/2014  . Chronic pain syndrome [G89.4] 04/23/2014  . Hepatic steatosis [K76.0] 04/23/2014    Past Psychiatric History:  See above noted  Past Medical History:  Past Medical History  Diagnosis Date  . Opioid use disorder, severe, in sustained remission, on maintenance therapy 2013    on Methadone maintenance  . Vaginal delivery ~ 1993    dtr 50 yo as of 04/2014  . Chronic pain decades    mostly spinal  . Obesity 2015    230#, 104 kg, BMI 33 as of 04/2014  . DM (diabetes mellitus) (HCC) 04/27/2014    Past Surgical History  Procedure Laterality Date  . Knee arthroscopy Right ~ 1995    twice.   Family History: History reviewed. No pertinent family history. Family Psychiatric  History:  denied Social History:  History  Alcohol Use No     History  Drug Use No    Social History   Social History  . Marital Status: Divorced    Spouse Name: N/A  . Number of Children: N/A  . Years of Education: N/A   Social History  Main Topics  . Smoking status: Current Every Day Smoker -- 2.00 packs/day  . Smokeless tobacco: None  . Alcohol Use: No  . Drug Use: No  . Sexual Activity: Not Asked   Other Topics Concern  . None   Social History Narrative    Hospital Course:  Valerie Woodard, 50 y.o. Female presented unaccompanied to [facility] reporting symptoms of anxiety and depression, including suicidal ideation.  She reported worsening depression since parents died last year. Valerie Woodard was admitted for Major depressive disorder [facility]) and crisis management.  She was treated with the following medications listed below.  Valerie Woodard was discharged with current medication and was instructed on how to take medications as prescribed; (details listed below under Medication List).  Medical problems were identified and treated as needed.  Home medications were restarted as appropriate.  Improvement was monitored by observation and Valerie Woodard daily report of symptom reduction.  Emotional and mental status was monitored by daily self-inventory reports completed by Valerie Woodard and clinical staff.         Valerie Woodard was evaluated by the treatment team for stability and plans for continued recovery upon discharge.  Valerie Woodard motivation was an integral factor for scheduling further treatment.  Employment, transportation, bed availability, health status, family support, and any pending legal issues were also considered during her hospital stay.  She was offered further treatment options upon discharge including but not limited to Residential, Intensive Outpatient, and Outpatient treatment.  Valerie Fail  Woodard will follow up with the services as listed below under Follow Up Information.     Upon completion of this admission the Valerie Woodard was both mentally and medically stable for discharge denying suicidal/homicidal ideation, auditory/visual/tactile hallucinations, delusional thoughts and paranoia.     Physical Findings: AIMS: Facial and Oral  Movements Muscles of Facial Expression: None, normal Lips and Perioral Area: None, normal Jaw: None, normal Tongue: None, normal,Extremity Movements Upper (arms, wrists, hands, fingers): None, normal Lower (legs, knees, ankles, toes): None, normal, Trunk Movements Neck, shoulders, hips: None, normal, Overall Severity Severity of abnormal movements (highest score from questions above): None, normal Incapacitation due to abnormal movements: None, normal Patient's awareness of abnormal movements (rate only patient's report): No Awareness, Dental Status Current problems with teeth and/or dentures?: No Does patient usually wear dentures?: No  CIWA:  CIWA-Ar Total: 1 COWS:  COWS Total Score: 1  Musculoskeletal: Strength & Muscle Tone: within normal limits Gait & Station: normal Patient leans: N/A  Psychiatric Specialty Exam:  See MD SRA Review of Systems  All other systems reviewed and are negative.   Blood pressure 135/73, pulse 80, temperature 98 F (36.7 C), temperature source Oral, resp. rate 18, height  (1.702 m), weight 92.08 kg (203 lb), SpO2 98 %.Body mass index is 31.79 kg/(m^2).  Have you used any form of tobacco in the last 30 days? (Cigarettes, Smokeless Tobacco, Cigars, and/or Pipes): Yes  Has this patient used any form of tobacco in the last 30 days? (Cigarettes, Smokeless Tobacco, Cigars, and/or Pipes) Yes, Rx given  Blood Alcohol level:  Lab Results  Component Value Date   [facility] <5 11/28/2015   ETH <11 04/23/2014    Metabolic Disorder Labs:  Lab Results  Component Value Date   HGBA1C 6.3* 04/25/2014   MPG 134* 04/25/2014   No results found for: PROLACTIN Lab Results  Component Value Date   CHOL 287* 04/22/2014   TRIG 432* 04/22/2014   HDL 25* 04/22/2014   CHOLHDL 11.5 04/22/2014   VLDL UNABLE TO CALCULATE IF TRIGLYCERIDE OVER 400 mg/dL 16/07/9603   LDLCALC UNABLE TO CALCULATE IF TRIGLYCERIDE OVER 400 mg/dL 54/06/8118    See Psychiatric Specialty Exam  and Suicide Risk Assessment completed by Attending Physician prior to discharge.  Discharge destination:  Home  Is patient on multiple antipsychotic therapies at discharge:  No   Has Patient had three or more failed trials of antipsychotic monotherapy by history:  No  Recommended Plan for Multiple Antipsychotic Therapies: NA     Medication List    STOP taking these medications        citalopram 20 MG tablet  Commonly known as:  CELEXA     METHADONE HCL PO      TAKE these medications      Indication   ARIPiprazole 5 MG tablet  Commonly known as:  ABILIFY  Take 1 tablet (5 mg total) by mouth daily.   Indication:  mood stabilization     DULoxetine 60 MG capsule  Commonly known as:  CYMBALTA  Take 1 capsule (60 mg total) by mouth 2 (two) times daily.   Indication:  Major Depressive Disorder     gabapentin 300 MG capsule  Commonly known as:  NEURONTIN  Take 1 capsule (300 mg total) by mouth 2 (two) times daily.   Indication:  Aggressive Behavior, Agitation     levothyroxine 50 MCG tablet  Commonly known as:  SYNTHROID, LEVOTHROID  Take 1 tablet (50 mcg total) by mouth daily  with breakfast.   Indication:  Underactive Thyroid     nicotine 21 mg/24hr patch  Commonly known as:  NICODERM CQ - dosed in mg/24 hours  Place 1 patch (21 mg total) onto the skin daily.   Indication:  Nicotine Addiction           Follow-up Information    Follow up with [facility] On 12/16/2015.   Why:  at 8:30am for your hospital discharge appointment with Nyra Jabs   Contact information:   [address] [place]  [address] Telephone:  [phone]      Follow-up recommendations:  Activity:  as tol Diet:  as tol  Comments:  1.  Take all your medications as prescribed.              2.  Report any adverse side effects to outpatient provider.                       3.  Patient instructed to not use alcohol or illegal drugs while on prescription medicines.             4.  In the event of worsening symptoms, instructed patient to call 911, the crisis hotline or go to nearest emergency room for evaluation of symptoms.  Signed: Lindwood Qua, NP [facility] 12/15/2015, 1:56 PM  Patient seen, Suicide Assessment Completed.  Disposition Plan Reviewed

## 2015-12-15 NOTE — Progress Notes (Addendum)
   12/14/15 2320  What Happened  Was fall witnessed? No  Was patient injured? No  Patient found other (Comment) (in room on bed)  Found by No one-pt stated they fell  Stated prior activity bathroom-unassisted  Follow Up  MD notified Karleen Hampshire PA  Time MD notified 2325  Family notified No- patient refusal  Time family notified (pt. refused)  Additional tests No  Simple treatment Ice  Adult Fall Risk Assessment  Risk Factor Category (scoring not indicated) High fall risk per protocol (document High fall risk)  Patient's Fall Risk High Fall Risk (>13 points)  Adult Fall Risk Interventions  Required Bundle Interventions *See Row Information* High fall risk - low, moderate, and high requirements implemented  Additional Interventions Fall risk signage;Assess orthostatic BP;Specialty bed:  Low bed  Fall with Injury Screening  Risk For Fall Injury- See Row Information  Nurse judgement  Intervention(s) for 2 or more risk criteria identified Low Bed  Vitals  Temp 97.7 F (36.5 C)  Temp Source Oral  BP 139/88 mmHg  BP Location Right Arm  BP Method Automatic  Patient Position (if appropriate) Sitting  Pulse Rate 93  Pulse Rate Source Dinamap  Resp 18  Oxygen Therapy  SpO2 99 %  O2 Device Room Air  Pain Assessment  Pain Assessment 0-10  Pain Score 5  Pain Type Acute pain  Pain Location Hip  Pain Orientation Right  Pain Descriptors / Indicators Aching;Constant  Pain Frequency Constant  Pain Onset On-going  Patients Stated Pain Goal 0  Pain Intervention(s) MD notified (Comment)  Multiple Pain Sites No  PAINAD (Pain Assessment in Advanced Dementia)  Breathing 0  Negative Vocalization 0  Facial Expression 0  Body Language 0  Consolability 0  PAINAD Score 0  Critical Care Pain Observation Tool (CPOT)  Facial Expression 0  Body Movements 0  Muscle Tension 0  Neurological  Neuro (WDL) X  Level of Consciousness Alert  Orientation Level Oriented X4  Cognition Appropriate  safety awareness;Follows commands  Speech Clear;Appropriate at baseline  Glasgow Coma Scale  Eye Opening 4  Best Verbal Response (NON-intubated) 5  Best Motor Response 6  Glasgow Coma Scale Score 15  Musculoskeletal  Musculoskeletal (WDL) WDL  Integumentary  Integumentary (WDL) WDL

## 2015-12-15 NOTE — Progress Notes (Signed)
  [facility] Adult Case Management Discharge Plan :  Will you be returning to the same living situation after discharge:  Yes,  Pt returning home At discharge, do you have transportation home?: Yes,  Pt provided with bus pass and PART fare. Do you have the ability to pay for your medications: Yes,  Pt provided with supply and prescriptions  Release of information consent forms completed and in the chart;  Patient's signature needed at discharge.  Patient to Follow up at: Follow-up Information    Follow up with [facility] On 12/16/2015.   Why:  at 8:30am for your hospital discharge appointment with Nyra Jabs   Contact information:   [address] [place]  [address] Telephone:  [phone]      Next level of care provider has access to [facility] Link:no  Safety Planning and Suicide Prevention discussed: Yes,  with Pt; declines family contact  Have you used any form of tobacco in the last 30 days? (Cigarettes, Smokeless Tobacco, Cigars, and/or Pipes): Yes  Has patient been referred to the Quitline?: Patient refused referral  Patient has been referred for addiction treatment: N/A  Elaina Hoops 12/15/2015, 11:42 AM

## 2015-12-15 NOTE — Progress Notes (Signed)
Discharge note:  Patient received all personal belongings from locker and unit.  Reviewed discharge/AVS/medications/prescriptions/follow up appointments.  Patient remains chronically passive suicidal, however, she contracts for safety and feels ready for discharge.  She denies HI/AVH.  Patient left ambulatory by [facility].  Patient seemed in good spirits upon discharge.

## 2015-12-15 NOTE — Tx Team (Signed)
Interdisciplinary Treatment Plan Update (Adult) Date: 12/15/2015  Date: 12/15/2015 11:41 AM  Progress in Treatment:  Attending groups: Yes Participating in groups: Yes, minimally Taking medication as prescribed: Yes  Tolerating medication: Yes  Family/Significant othe contact made: No, Pt declines Patient understands diagnosis: Yes AEB seeking help with depression Discussing patient identified problems/goals with staff: Yes  Medical problems stabilized or resolved: Yes  Denies suicidal/homicidal ideation: Pt endorses SI at times, denies it at others Patient has not harmed self or Others: Yes   New problem(s) identified: None identified at this time.   Discharge Plan or Barriers: Pt will return home and follow-up with outpatient resources  Additional comments:  Patient and CSW reviewed pt's identified goals and treatment plan. Patient verbalized understanding and agreed to treatment plan. CSW reviewed [facility] "Discharge Process and Patient Involvement" Form. Pt verbalized understanding of information provided and signed form.   Reason for Continuation of Hospitalization:  Anxiety Depression Medication stabilization Suicidal ideation  Estimated length of stay: 0 days  Review of initial/current patient goals per problem list:   1.  Goal(s): Patient will participate in aftercare plan  Met:  Yes  Target date: 3-5 days from date of admission   As evidenced by: Patient will participate within aftercare plan AEB aftercare provider and housing plan at discharge being identified.   11/30/15: Pt will return home and follow-up with outpatient resources  2.  Goal (s): Patient will exhibit decreased depressive symptoms and suicidal ideations.  Met:  Adequate for DC  Target date: 3-5 days from date of admission   As evidenced by: Patient will utilize self rating of depression at 3 or below and demonstrate decreased signs of depression or be deemed stable for discharge by MD. 11/30/15: Pt was  admitted with symptoms of depression, rating 10/10. Pt continues to present with flat affect and depressive symptoms. 12/07/15: Pt continues to rate depression at 9/10 and endorses passive SI   12/11/15: Pt reports high levels of depression and endorses SI off and on 12/15/15: MD feels that Pt's symptoms have decreased to the point that they can be managed in an outpatient setting.   3.  Goal(s): Patient will demonstrate decreased signs and symptoms of anxiety.  Met:  Adequate for DC  Target date: 3-5 days from date of admission   As evidenced by: Patient will utilize self rating of anxiety at 3 or below and demonstrated decreased signs of anxiety, or be deemed stable for discharge by MD 11/30/15: Pt was admitted with increased levels of anxiety and is currently rating those symptoms highly.  12/07/15: Pt continues to rate anxiety at 10/10. 12/11/15: Pt endorses high levels of anxiety 12/15/15: MD feels that Pt's symptoms have decreased to the point that they can be managed in an outpatient setting.   Attendees:  Patient:    Family:    Physician: Dr. Parke Poisson, MD  12/15/2015 11:41 AM  Nursing: Lars Pinks, RN Case manager  12/15/2015 11:41 AM  Clinical Social Worker Peri Maris, [place] 12/15/2015 11:41 AM  Other: Tilden Fossa, [place] 12/15/2015 11:41 AM  Clinical: Darrol Angel, RN; Gaylan Gerold, RN 12/15/2015 11:41 AM  Other: , RN Charge Nurse 12/15/2015 11:41 AM  Other:     Peri Maris, [place] Social Work [phone]

## 2018-05-16 ENCOUNTER — Emergency Department (HOSPITAL_COMMUNITY)
Admission: EM | Admit: 2018-05-16 | Discharge: 2018-05-16 | Disposition: A | Discharge status: Home / Self Care | Payer: Self-pay | Attending: Emergency Medicine | Admitting: Emergency Medicine

## 2018-05-16 ENCOUNTER — Encounter (HOSPITAL_COMMUNITY): Payer: Self-pay | Admitting: *Deleted

## 2018-05-16 ENCOUNTER — Other Ambulatory Visit: Payer: Self-pay

## 2018-05-16 DIAGNOSIS — M7662 Achilles tendinitis, left leg: Secondary | ICD-10-CM | POA: Insufficient documentation

## 2018-05-16 DIAGNOSIS — E039 Hypothyroidism, unspecified: Secondary | ICD-10-CM | POA: Insufficient documentation

## 2018-05-16 DIAGNOSIS — F17210 Nicotine dependence, cigarettes, uncomplicated: Secondary | ICD-10-CM | POA: Insufficient documentation

## 2018-05-16 DIAGNOSIS — Z79899 Other long term (current) drug therapy: Secondary | ICD-10-CM | POA: Insufficient documentation

## 2018-05-16 DIAGNOSIS — E119 Type 2 diabetes mellitus without complications: Secondary | ICD-10-CM | POA: Insufficient documentation

## 2018-05-16 NOTE — ED Provider Notes (Signed)
[facility] EMERGENCY DEPARTMENT Provider Note   CSN: [id number] Arrival date & time: 05/16/18  1220     History   Chief Complaint Chief Complaint  Patient presents with  . Ankle Pain    HPI Valerie Woodard is a 52 y.o. female.  52 year old female presents with ongoing left ankle pain.  Patient states symptoms started about 11 weeks ago, she was seen at [facility] ER on May 25 for this and had an x-ray which showed a bone spur and she was diagnosed with tendinitis.  Patient has tried wearing shoes that do not compress the back of her ankle however most shoes press this area and make her pain worse.  Pain is worse with ambulating.  She denies falls, injuries, previous problems with this leg or ankle.  No other complaints or concerns.     FINDINGS: Frontal, oblique, and lateral views were obtained. No evident fracture or joint effusion. No appreciable joint space narrowing or erosion. Ankle mortise appears intact. There is an inferior calcaneal spur. No soft tissue mass evident by radiography..  IMPRESSION: Inferior calcaneal spur. No appreciable joint space narrowing or erosion. No fracture. Ankle mortise appears intact. No soft tissue mass appreciable by radiography.  Electronically Signed By: Bretta Bang III M.D. On: 03/17/2018 17:21         Past Medical History:  Diagnosis Date  . Chronic pain decades   mostly spinal  . DM (diabetes mellitus) (HCC) 04/27/2014  . Obesity 2015   230#, 104 kg, BMI 33 as of 04/2014  . Opioid use disorder, severe, in sustained remission, on maintenance therapy (HCC) 2013   on Methadone maintenance  . Vaginal delivery ~ 48   dtr 52 yo as of 04/2014    Patient Active Problem List   Diagnosis Date Noted  . Severe single current episode of major depressive disorder, without psychotic features (HCC)   . Hypothyroidism 12/02/2015  . Major depressive disorder 11/29/2015  . 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  Metabolic syndrome 04/27/2014  . Other and unspecified hyperlipidemia 04/27/2014  . DM (diabetes mellitus) (HCC) 04/27/2014  . Pancreatitis 04/23/2014  . Acute pancreatitis 04/23/2014  . Chest pain 04/23/2014  . Chronic pain syndrome 04/23/2014  . Hepatic steatosis 04/23/2014    Past Surgical History:  Procedure Laterality Date  . KNEE ARTHROSCOPY Right ~ 1995   twice.     OB History   None      Home Medications    Prior to Admission medications   Medication Sig Start Date End Date Taking? Authorizing Provider  ARIPiprazole (ABILIFY) 5 MG tablet Take 1 tablet (5 mg total) by mouth daily. 12/15/15   Adonis Brook, NP  DULoxetine (CYMBALTA) 60 MG capsule Take 1 capsule (60 mg total) by mouth 2 (two) times daily. 12/15/15   Adonis Brook, NP  gabapentin (NEURONTIN) 300 MG capsule Take 1 capsule (300 mg total) by mouth 2 (two) times daily. 12/15/15   Adonis Brook, NP  levothyroxine (SYNTHROID, LEVOTHROID) 50 MCG tablet Take 1 tablet (50 mcg total) by mouth daily with breakfast. 12/15/15   Adonis Brook, NP  nicotine (NICODERM CQ - DOSED IN MG/24 HOURS) 21 mg/24hr patch Place 1 patch (21 mg total) onto the skin daily. 12/15/15   Adonis Brook, NP    Family History History reviewed. No pertinent family history.  Social History Social History   Tobacco Use  . Smoking status: Current Every Day Smoker    Packs/day: 2.00  Substance Use Topics  .  Alcohol use: No  . Drug use: No     Allergies   Patient has no known allergies.   Review of Systems Review of Systems  Constitutional: Negative for fever.  Musculoskeletal: Positive for arthralgias, gait problem and myalgias.  Skin: Negative for color change, rash and wound.  Allergic/Immunologic: Negative for immunocompromised state.  Neurological: Negative for weakness and numbness.  Hematological: Does not bruise/bleed easily.  Psychiatric/Behavioral: Negative for confusion.  All other systems reviewed and are  negative.    Physical Exam Updated Vital Signs BP (!) 174/106 (BP Location: Right Arm)   Pulse 87   Temp 98.2 F (36.8 C) (Oral)   Resp 16   SpO2 99%   Physical Exam  Constitutional: She is oriented to person, place, and time. She appears well-developed and well-nourished. No distress.  HENT:  Head: Normocephalic and atraumatic.  Pulmonary/Chest: Effort normal.  Musculoskeletal: She exhibits tenderness. She exhibits no deformity.       Left ankle: She exhibits decreased range of motion. She exhibits no swelling, no ecchymosis, no deformity, no laceration and normal pulse. No lateral malleolus, no medial malleolus, no head of 5th metatarsal and no proximal fibula tenderness found. Achilles tendon exhibits pain. Achilles tendon exhibits no defect.       Feet:  Neurological: She is alert and oriented to person, place, and time.  Skin: Skin is warm and dry. She is not diaphoretic. No erythema.  Psychiatric: She has a normal mood and affect. Her behavior is normal.  Nursing note and vitals reviewed.    ED Treatments / Results  Labs (all labs ordered are listed, but only abnormal results are displayed) Labs Reviewed - No data to display  EKG None  Radiology No results found.  Procedures Procedures (including critical care time)  Medications Ordered in ED Medications - No data to display   Initial Impression / Assessment and Plan / ED Course  I have reviewed the triage vital signs and the nursing notes.  Pertinent labs & imaging results that were available during my care of the patient were reviewed by me and considered in my medical decision making (see chart for details).  Clinical Course as of May 17 1307  Wed May 16, 2018  [id number] 52 year old female with previous diagnosis of Achilles tendinitis presents with ongoing pain.  Patient has an orange card and was referred to podiatry however has been told it will take several months for her to get into see a podiatrist.   She denies falls or injuries since [facility] which are attached in her HPI.  Patient is currently wearing ballet flats is any other shoe compresses her Achilles tendon is more painful for her.  Patient was placed in a cam walker, referred to orthopedics, recommend warm compresses and gentle stretching.   [LM]    Clinical Course User Index [LM] Jeannie FendMurphy, Marnae Madani A, PA-C   Final Clinical Impressions(s) / ED Diagnoses   Final diagnoses:  Tendonitis, Achilles, left    ED Discharge Orders    None       Alden HippMurphy, Fadil Macmaster A, PA-C 05/16/18 1308    Mesner, Barbara CowerJason, MD 05/18/18 [phone]

## 2018-05-16 NOTE — ED Triage Notes (Signed)
Pt in c/o left ankle pain x11 weeks, pain is getting progressively worse

## 2018-05-16 NOTE — Discharge Instructions (Addendum)
Apply warm compresses to left ankle for 20 minutes at a time.  Stretch as previously directed.  Follow-up with orthopedics.

## 2018-05-16 NOTE — ED Notes (Signed)
Declined W/C at D/C and was escorted to lobby by RN. 

## 2018-07-30 ENCOUNTER — Ambulatory Visit: Payer: Self-pay | Attending: Family Medicine | Admitting: Podiatry

## 2018-07-30 DIAGNOSIS — S96919A Strain of unspecified muscle and tendon at ankle and foot level, unspecified foot, initial encounter: Secondary | ICD-10-CM

## 2018-07-30 DIAGNOSIS — S86011S Strain of right Achilles tendon, sequela: Secondary | ICD-10-CM

## 2018-07-30 MED ORDER — DICLOFENAC SODIUM 1 % TD GEL: 2.0000 g | Freq: Four times a day (QID) | TRANSDERMAL | 2 refills | Status: AC

## 2018-07-31 ENCOUNTER — Telehealth: Payer: Self-pay | Admitting: *Deleted

## 2018-07-31 NOTE — Progress Notes (Signed)
Subjective:   Patient ID: Valerie Woodard, female   DOB: 52 y.o.   MRN: [id number]   HPI 52 [facility] of [facility] pain.  She states that she has come to the emergency room and she was told she had a bone spur but she also had issue with her Achilles tendon and there was concern for tear.  She had x-rays performed however that she states that they discussed getting an MRI.  She has had ongoing symptoms for last 4 to 5 months without any significant improvement she states that she has difficulty walking because of the pain.  She does note some chronic swelling to the back of her ankle and she points on the Achilles tendon.  She does have a cam boot at home that she had previously but she has not been wearing this because it is heavy.  She has no other concerns.   Review of Systems  All other systems reviewed and are negative.  Past Medical History:  Diagnosis Date  . Chronic pain decades   mostly spinal  . DM (diabetes mellitus) (HCC) 04/27/2014  . Obesity 2015   230#, 104 kg, BMI 33 as of 04/2014  . Opioid use disorder, severe, in sustained remission, on maintenance therapy (HCC) 2013   on Methadone maintenance  . Vaginal delivery ~ 65   dtr 52 yo as of 04/2014    Past Surgical History:  Procedure Laterality Date  . KNEE ARTHROSCOPY Right ~ 1995   twice.     Current Outpatient Medications:  .  ARIPiprazole (ABILIFY) 5 MG tablet, Take 1 tablet (5 mg total) by mouth daily., Disp: 30 tablet, Rfl: 0 .  diclofenac sodium (VOLTAREN) 1 % GEL, Apply 2 g topically 4 (four) times daily. Rub into affected area of foot 2 to 4 times daily, Disp: 100 g, Rfl: 2 .  DULoxetine (CYMBALTA) 60 MG capsule, Take 1 capsule (60 mg total) by mouth 2 (two) times daily., Disp: 60 capsule, Rfl: 0 .  gabapentin (NEURONTIN) 300 MG capsule, Take 1 capsule (300 mg total) by mouth 2 (two) times daily., Disp: 60 capsule, Rfl: 0 .  levothyroxine  (SYNTHROID, LEVOTHROID) 50 MCG tablet, Take 1 tablet (50 mcg total) by mouth daily with breakfast., Disp: 30 tablet, Rfl: 0 .  nicotine (NICODERM CQ - DOSED IN MG/24 HOURS) 21 mg/24hr patch, Place 1 patch (21 mg total) onto the skin daily., Disp: 28 patch, Rfl: 0  No Known Allergies        Objective:  Physical Exam   General: AAO x3, NAD  Dermatological: Skin is warm, dry and supple bilateral. Nails x 10 are well manicured; remaining integument appears unremarkable at this time. There are no open sores, no preulcerative lesions, no rash or signs of infection present.  Vascular: Dorsalis Pedis artery and Posterior Tibial artery pedal pulses are 2/4 bilateral with immedate capillary fill time. There is no pain with calf compression, swelling, warmth, erythema.   Neruologic: Grossly intact via light touch bilateral.  Musculoskeletal: There is significant thickening to the distal portion of the Achilles tendon approximately insertion of the calcaneus.  There is edema to this area there is tenderness palpation on this area.  There is quite a bit of tenderness even with light touch to this area.  There is no pain with lateral compression of calcaneus.  Mild discomfort on the course the posterior tibial, flexor tendons.  There is no area pinpoint bony tenderness  or pain to vibratory sensation.  Muscular strength 5/5 in all groups tested bilateral.  Gait: Unassisted, Nonantalgic.     Assessment:   52 year old female significant Achilles tendinosis, rule out tear     Plan:  -Treatment options discussed including all alternatives, risks, and complications -Etiology of symptoms were discussed -Reviewed her chart.  Given her ongoing symptoms I do recommend an MRI to rule out a Achilles tendon tear given the significant thickening of pain she is having as well as swelling.  She has a cam boot at home and I want her to go back to wearing this.  Continue with anti-inflammatories as she is Artie  taking also prescribed Voltaren gel for her.  I will see her back after the MRI or sooner if any issues are to arise.  I ordered this for the [facility].  I discussed with her to give me a call if she does not hear from them about the next scheduled she verbally understood this.  Vivi Barrack DPM

## 2018-07-31 NOTE — Telephone Encounter (Signed)
I spoke with [facility] and she states they have received the order. I explained that pt has [facility] card and Destiny states they will call and schedule pt.

## 2018-08-04 ENCOUNTER — Ambulatory Visit (HOSPITAL_BASED_OUTPATIENT_CLINIC_OR_DEPARTMENT_OTHER)
Admission: RE | Admit: 2018-08-04 | Discharge: 2018-08-04 | Disposition: A | Discharge status: Home / Self Care | Payer: Self-pay | Source: Ambulatory Visit | Attending: Podiatry | Admitting: Podiatry

## 2018-08-04 DIAGNOSIS — X58XXXA Exposure to other specified factors, initial encounter: Secondary | ICD-10-CM | POA: Insufficient documentation

## 2018-08-04 DIAGNOSIS — M659 Synovitis and tenosynovitis, unspecified: Secondary | ICD-10-CM | POA: Insufficient documentation

## 2018-08-04 DIAGNOSIS — S96919A Strain of unspecified muscle and tendon at ankle and foot level, unspecified foot, initial encounter: Secondary | ICD-10-CM | POA: Insufficient documentation

## 2018-08-22 ENCOUNTER — Telehealth: Payer: Self-pay | Admitting: *Deleted

## 2018-08-22 NOTE — Telephone Encounter (Signed)
Left message requesting pt to call for results of MRI and instructions.

## 2018-08-22 NOTE — Telephone Encounter (Signed)
-----   Message from Vivi Barrack, DPM sent at 08/21/2018  6:07 PM EDT ----- Val- please let her know that the MRI did not show a tear but some moderate tendonitis. Please refer her to [facility] PT. I saw her at the [facility] and [facility].

## 2018-08-23 NOTE — Telephone Encounter (Signed)
Pt called and I informed of Dr. Gabriel Rung review of MRI results and orders. Pt states she is going to PT in [place] in the old [employer], and would like to continue there. I asked pt to get their name, phone and fax number.

## 2019-01-21 IMAGING — MR MR ANKLE*L* W/O CM
5 of 7 series · 33 of 40 positions shown · non-contrast
Comparison: Left ankle x-rays dated March 17, 2018.

CLINICAL DATA: Posterior ankle pain and diffuse swelling with
decreased range of motion for the past 5 months. No injury.

EXAM:
MRI OF THE LEFT ANKLE WITHOUT CONTRAST
TECHNIQUE: Multiplanar, multisequence MR imaging of the ankle was performed. No
intravenous contrast was administered.

[Series 3: T2 fat-sat · axial · 4.0mm · 0.50mm/px · z∈[-63,+56]mm · 7 of 25 slices shown (1 of 3)]
[im 1/25]
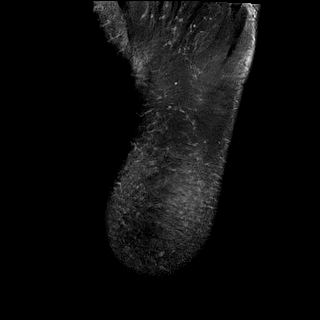
[im 5/25]
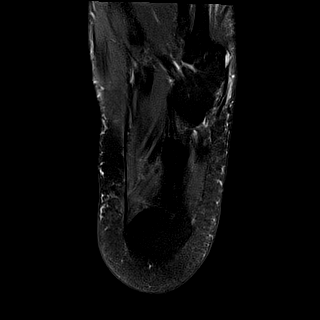
[im 9/25]
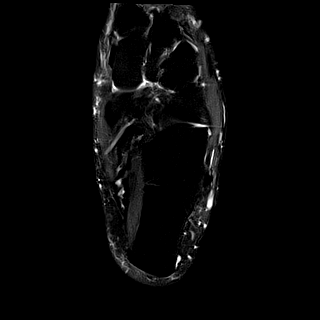
[im 13/25]
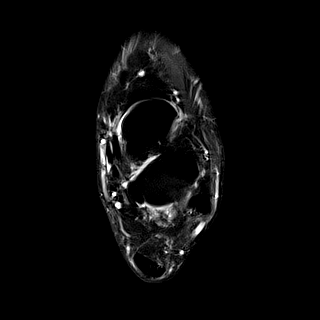
[im 17/25]
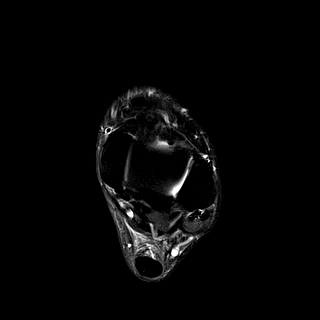
[im 21/25]
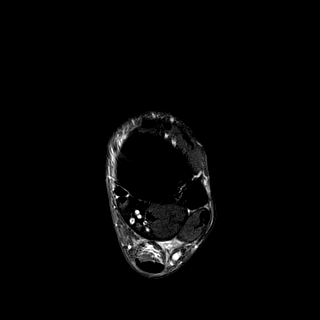
[im 25/25]
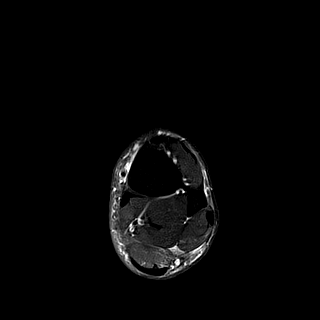

[Series 4: PD fat-sat · axial · 4.0mm · 0.50mm/px · z∈[-63,+56]mm · 8 of 25 slices shown]
[im 1/25]
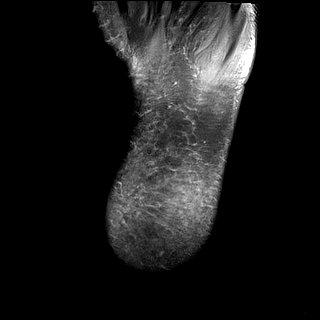
[im 4/25]
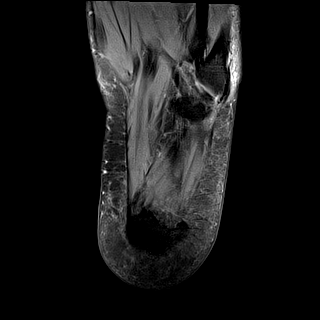
[im 7/25]
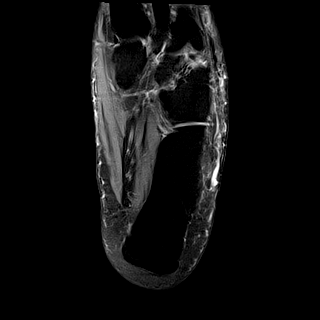
[im 11/25]
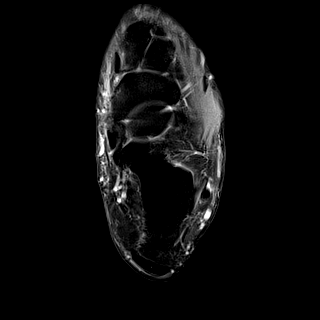
[im 14/25]
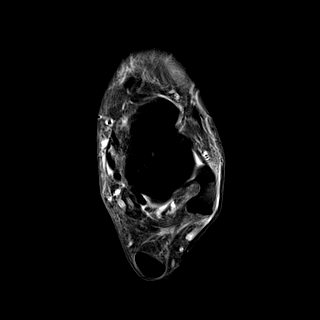
[im 18/25]
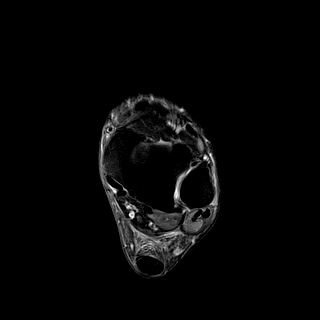
[im 21/25]
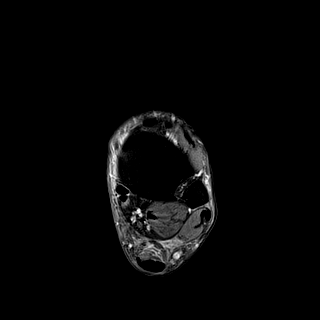
[im 25/25]
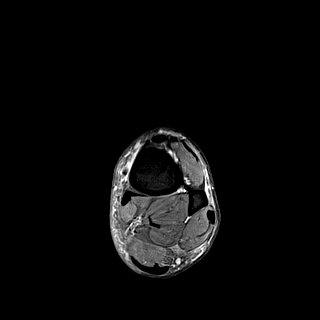

[Series 5: T1 · sagittal · 4.0mm · 0.25mm/px · 5 of 16 slices shown]
[im 1/16]
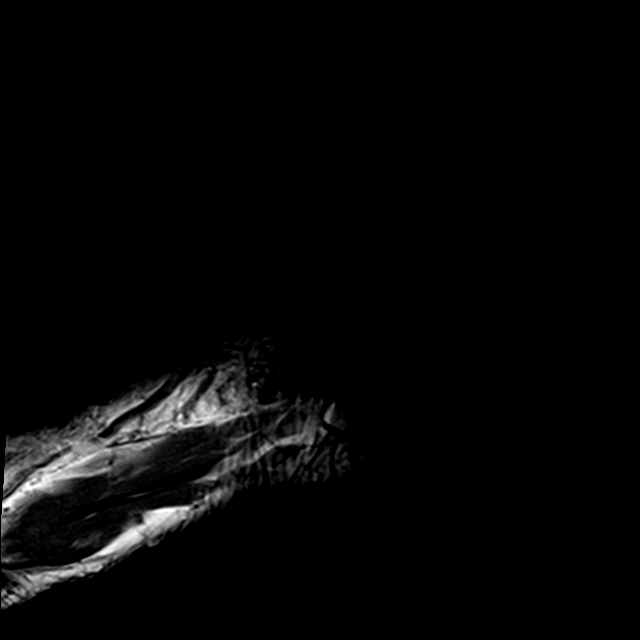
[im 4/16]
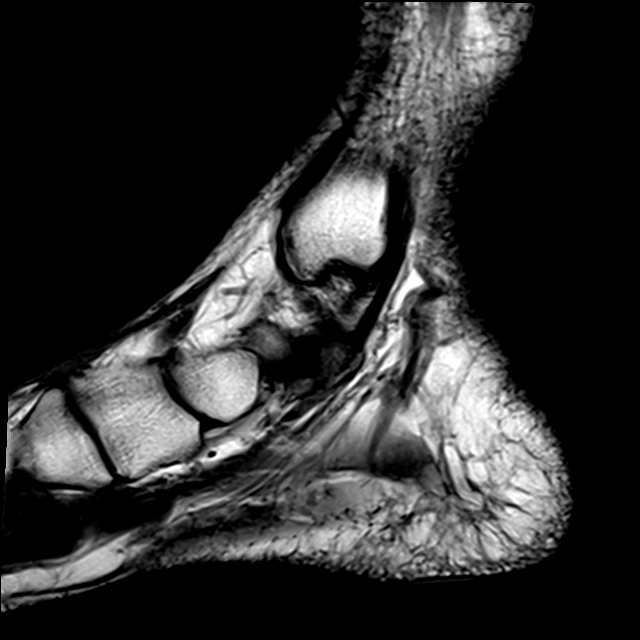
[im 8/16]
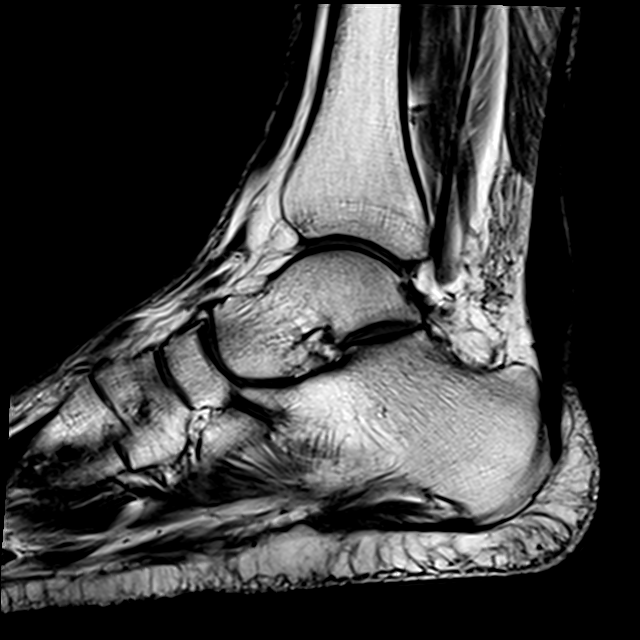
[im 12/16]
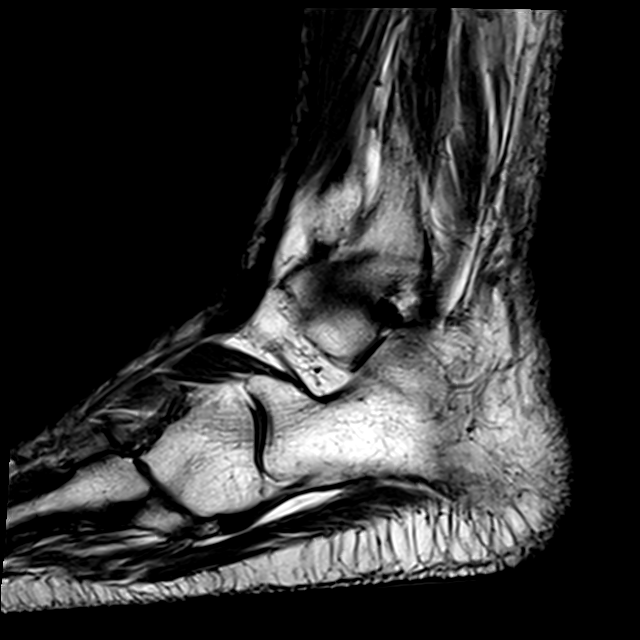
[im 16/16]
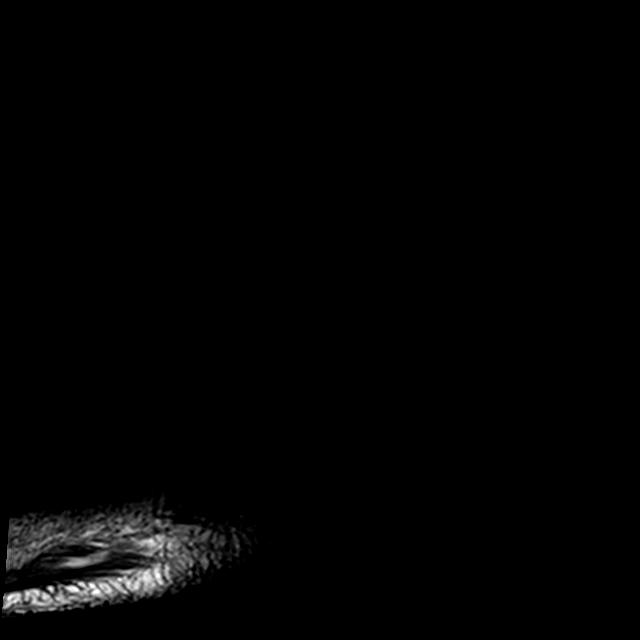

[Series 6: T2 fat-sat · sagittal · 4.0mm · 0.50mm/px · 5 of 16 slices shown (2 of 3)]
[im 1/16]
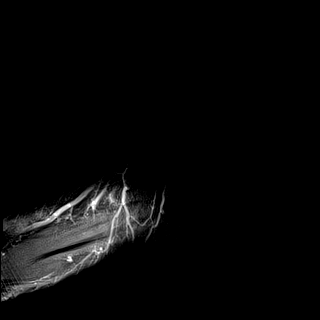
[im 4/16]
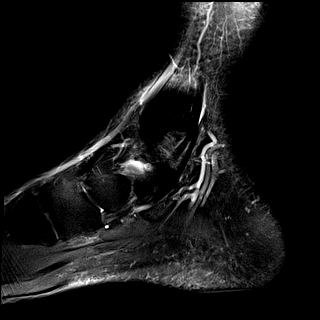
[im 8/16]
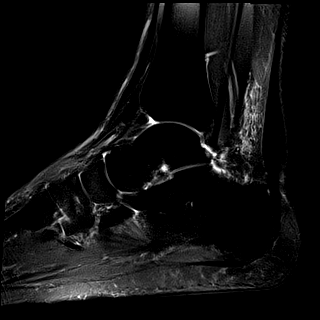
[im 12/16]
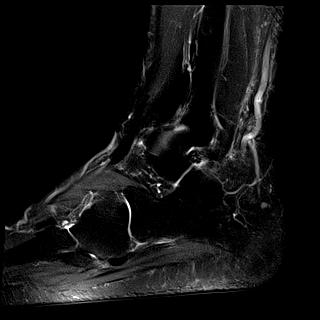
[im 16/16]
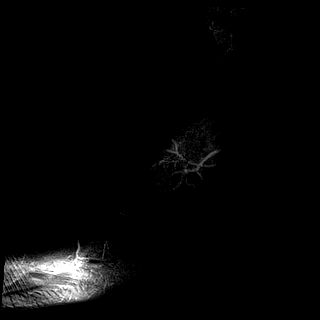

[Series 8: T2 fat-sat · coronal · 4.0mm · 0.50mm/px · 8 of 27 slices shown (3 of 3)]
[im 1/27]
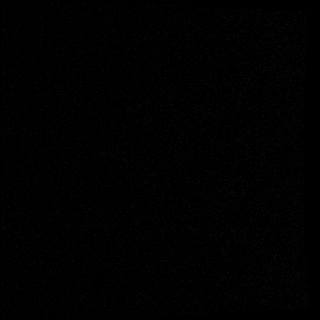
[im 4/27]
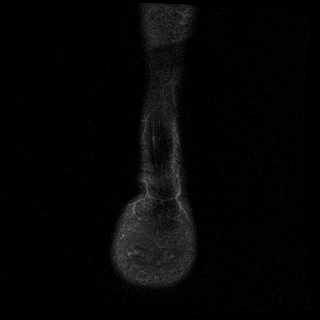
[im 8/27]
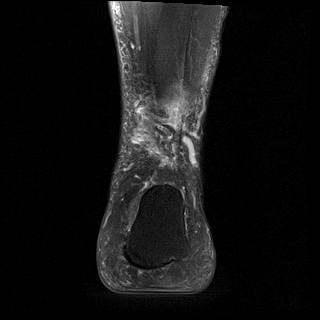
[im 12/27]
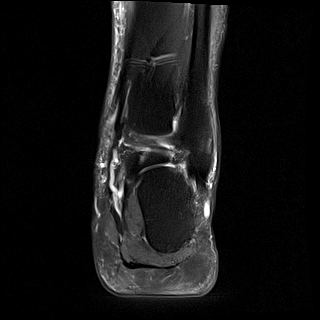
[im 15/27]
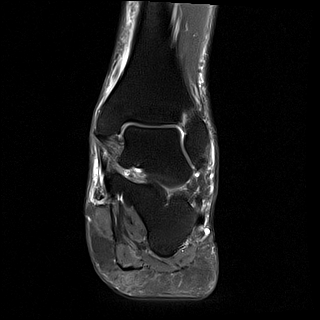
[im 19/27]
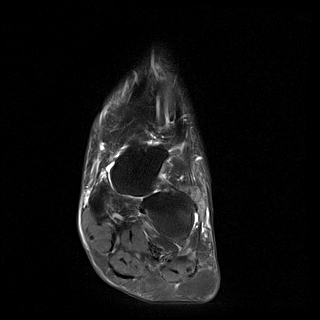
[im 23/27]
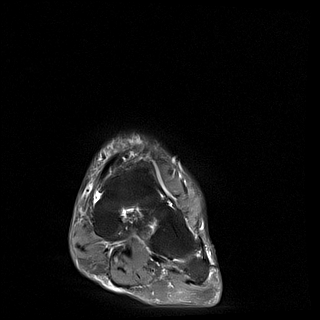
[im 27/27]
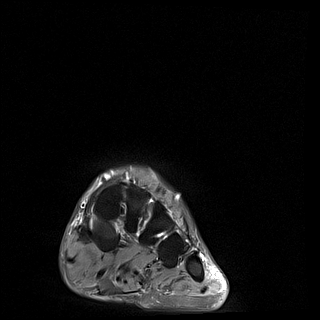

[33 of 40 positions shown; findings below may reference images not displayed]

FINDINGS: TENDONS

Peroneal: Peroneal longus tendon intact. Peroneal brevis intact.

Posteromedial: Posterior tibial tendon intact. Flexor hallucis
longus tendon intact. Flexor digitorum longus tendon intact. Small
amount of fluid in the posterior tibialis tendon sheath.

Anterior: Tibialis anterior tendon intact. Extensor hallucis longus
tendon intact Extensor digitorum longus tendon intact.

Achilles:  Intact. Moderate focal thickening of the mid tendon.

Plantar Fascia: Intact.

LIGAMENTS

Lateral: Anterior talofibular ligament intact. Calcaneofibular
ligament intact. Posterior talofibular ligament intact. Anterior and
posterior tibiofibular ligaments intact.

Medial: Deltoid ligament intact. Spring ligament intact.

CARTILAGE

Ankle Joint: No joint effusion. Normal ankle mortise. No chondral
defect.

Subtalar Joints/Sinus Tarsi: Normal subtalar joints. No subtalar
joint effusion. Normal sinus tarsi.

Bones: No marrow signal abnormality.  No fracture or dislocation.

Soft Tissue: No soft tissue mass or fluid collection. Small amount
of edema in Kager's fat.
IMPRESSION: 1. Moderate Achilles tendinosis.  No tear.
2. Mild posterior tibialis tenosynovitis.
